# Patient Record
Sex: Male | Born: 1950
Health system: Southern US, Community
[De-identification: ages and names within clinical notes are randomized; demographics above are authoritative.]

## PROBLEM LIST (undated history)

## (undated) DIAGNOSIS — E7800 Pure hypercholesterolemia, unspecified: Secondary | ICD-10-CM

## (undated) HISTORY — PX: HIP FRACTURE SURGERY: SHX118

## (undated) HISTORY — DX: Pure hypercholesterolemia, unspecified: E78.00

---

## 2019-09-21 ENCOUNTER — Encounter: Payer: Self-pay | Admitting: Orthopaedic Surgery

## 2019-09-21 ENCOUNTER — Ambulatory Visit (INDEPENDENT_AMBULATORY_CARE_PROVIDER_SITE_OTHER): Payer: Self-pay | Admitting: Orthopaedic Surgery

## 2019-09-21 DIAGNOSIS — M25562 Pain in left knee: Secondary | ICD-10-CM

## 2019-09-21 NOTE — Progress Notes (Signed)
Office Visit Note   Patient: James Mcdaniel           Date of Birth: 1950/12/30           MRN: [id number] Visit Date: 09/21/2019              Requested by: No referring provider defined for this encounter. PCP: No primary care provider on file.   Assessment & Plan: Visit Diagnoses:  1. Acute pain of left knee     Plan: James Mcdaniel had a rather acute onset of left knee pain at the end of last week.  X-rays were performed through his primary care physician's office demonstrating some mild degenerative changes but no acute osseous abnormalities.  He was placed on Naprosyn 5 mg p.o. twice daily and relates that his pain is completely resolved.  I suspect his problem is related to the arthritis and had a long discussion regarding medicines injections etc.  I think at this point he can probably stop the medicine and monitor his discomfort.  He can always restart the medicine if necessary.  Plan to see him back as needed.  All questions were answered  Follow-Up Instructions: Return if symptoms worsen or fail to improve.   Orders:  No orders of the defined types were placed in this encounter.  No orders of the defined types were placed in this encounter.     Procedures: No procedures performed   Clinical Data: No additional findings.   Subjective: Chief Complaint  Patient presents with  . Left Knee - Pain  Patient presents today for left knee pain. He states that his knee started hurting and unable to walk last Friday, but now is fine. His pain was located posteriorly. He had x-rays taken prior to today that we can view on the PACS system. He is concerned about another flare up. He has taken Naproxen and had no more pain after taking two tablets.   HPI  Review of Systems   Objective: Vital Signs: BP 123/62   Pulse (!) 57   Ht 5\' 9"  (1.753 James)   Wt 172 lb (78 kg)   BMI 25.40 kg/James   Physical Exam Constitutional:      Appearance: He is well-developed.  Eyes:   Pupils: Pupils are equal, round, and reactive to light.  Pulmonary:     Effort: Pulmonary effort is normal.  Skin:    General: Skin is warm and dry.  Neurological:     Mental Status: He is alert and oriented to person, place, and time.  Psychiatric:        Behavior: Behavior normal.     Ortho Exam left knee was not hot red warm or swollen.  No effusion.  No localized areas of tenderness along the lateral medial joint of the subpatellar region.  Full quick extension and flexion over 120 degrees without instability.  No masses.  No calf pain.  No distal edema.  Straight leg raise negative.  Painless range of motion left hip  Specialty Comments:  No specialty comments available.  Imaging: No results found.   PMFS History: Patient Active Problem List   Diagnosis Date Noted  . Pain in left knee 09/21/2019   History reviewed. No pertinent past medical history.  History reviewed. No pertinent family history.  History reviewed. No pertinent surgical history. Social History   Occupational History  . Not on file  Tobacco Use  . Smoking status: Not on file  Substance and Sexual Activity  .  Alcohol use: Not on file  . Drug use: Not on file  . Sexual activity: Not on file

## 2021-03-27 ENCOUNTER — Telehealth: Payer: Self-pay | Admitting: Neurology

## 2021-03-27 ENCOUNTER — Encounter: Payer: Self-pay | Admitting: Neurology

## 2021-03-27 ENCOUNTER — Ambulatory Visit (INDEPENDENT_AMBULATORY_CARE_PROVIDER_SITE_OTHER): Payer: Medicare Other | Admitting: Neurology

## 2021-03-27 VITALS — BP 174/83 | HR 52 | Ht 69.0 in | Wt 170.0 lb

## 2021-03-27 DIAGNOSIS — R413 Other amnesia: Secondary | ICD-10-CM | POA: Diagnosis not present

## 2021-03-27 DIAGNOSIS — R4189 Other symptoms and signs involving cognitive functions and awareness: Secondary | ICD-10-CM | POA: Diagnosis not present

## 2021-03-27 DIAGNOSIS — Z818 Family history of other mental and behavioral disorders: Secondary | ICD-10-CM

## 2021-03-27 NOTE — Patient Instructions (Addendum)
MRI of the brain - James Mcdaniel [facility] Blood work today Formal memory testing - dr Ruffin Pyo [facility] healthy Brain study - consider joining MIND diet - [employer], consider looking into  Recommendations to prevent or slow progression of cognitive decline:   Exercise You should increase exercise 30 to 45 minutes per day at least 3 days a week although 5 to 7 would be preferred. Any type of exercise (including walking) is acceptable although a recumbent bicycle may be best if you are unsteady. Disease related apathy can be a significant roadblock to exercise and the only way to overcome this is to make it a daily routine and perhaps have a reward at the end (something your loved one loves to eat or drink perhaps) or a personal trainer coming to the home can also be very useful. In general a structured, repetitive schedule is best.   Cardiovascular Health: You should optimize all cardiovascular risk factors (blood pressure, sugar, cholesterol) as vascular disease such as strokes and heart attacks can make memory problems much worse.   Diet: Eating a heart healthy (Mediterranean) diet is also a good idea; fish and poultry instead of red meat, nuts (mostly non-peanuts), vegetables, fruits, olive oil or canola oil (instead of butter), minimal salt (use other spices to flavor foods), whole grain rice, bread, cereal and pasta and wine in moderation.  General Health: Any diseases which effect your body will effect your brain such as a pneumonia, urinary infection, blood clot, heart attack or stroke. Keep contact with your primary care doctor for regular follow ups.  Sleep. A good nights sleep is healthy for the brain. Seven hours is recommended. If you have insomnia or poor sleep habits see the recommendations below  Tips: Structured and consistent daytime and nighttime routine, including regular wake times, bedtimes, and mealtimes, will be important for the patient to avoid confusion. Keeping frequently  used items in designated places will help reduce stress from searching. If there are worries about getting lost do not let the patient leave home unaccompanied. They might benefit from wearing an identification bracelet that will help others assist in finding home if they become lost. Information about nationwide safe return services and other helpful resources may be obtained through the Alzheimer's Association helpline at [phone].  Finances, Power of Restaurant manager, fast food Directives: You should consider putting legal safeguards in place with regard to financial and medical decision making. While the spouse always has power of attorney for medical and financial issues in the absence of any form, you should consider what you want in case the spouse / caregiver is no longer around or capable of making decisions.   [place] : MovieEvening.com.au.pdf  Or Google "[facility]" AND "An Proofreader for [employer]  Other States: PainBasics.com.au  The signature on these forms should be notarized.   DRIVING:   Driving only during the day Drive only to familiar Locations Avoid driving during bad weather  If you would like to be tested to see if you are driving safely, [facility] has a Clinical Driving Evaluation. To schedule an appointment call [phone].                RESOURCES:  Memory Loss: Improve your short term memory By Laverle Patter  The Alzheimer's Reading Room http://www.alzheimersreadingroom.com/   The Alzheimer's Compendium http://www.alzcompend.info/  [employer] www.dukefamilysupport.NLZ [phone]  Recommended resources for caregivers (All can be purchased on Amazon):  1) A Caregiver's Guide to Dementia: Using Activities and Other Strategies  to Prevent, Reduce and Manage Behavioral Symptoms by Mahala Menghini.  Bethena Midget and [employer]   2) A Caregiver's Guide to Owens & Minor Dementia by Briant Sites MS BSN and Velia Meyer   3) What If It's Not Alzheimer's?: A Caregiver's Guide to Dementia by Neila Gear (Author), Roberts Gaudy (Editor)  3) The 36 hour day by Rabins and Mace  4) Understanding Difficult Behaviors by Gerre Scull and White  Online course for helping caregivers reduce stress, guilt and frustration called the Caregivers Helpbook. The website is www.powerfultoolsforcaregivers.org  As a caregiver you are a Government social research officer. Problems you face as a caregiver are usually unique to your situation and the way your loved-one's disease manifests itself. The best way to use these books is to look at the Table of Contents and read any chapters of interest or that apply to challenges you are having as a caregiver.  NATIONAL RESOURCES: For more information on neurological disorders or research programs funded by the [employer] of Neurological Disorders and Stroke, contact the Institute's Gaffer (BRAIN) at: BRAIN [address] [place] [address] [phone] (toll-free) ToledoAutomobile.co.uk  Information on dementia is also available from the following organizations: Alzheimer's Disease Education and Referral (ADEAR) Center [employer] on Aging [address] [place] [address] [phone] (toll-free) CashCowGambling.be  Alzheimer's Association [address] [place], [place] [address] [phone] (toll-free, 24-hour helpline) [phone] (TDD) LimitLaws.hu  Alzheimer's Foundation of [place] [address] [place], [place] [address] [phone] (toll-free) www.alzfdn.org  Alzheimer's Drug Discovery Foundation [address] [place], [place] [address] [phone] www.alzdiscovery.org  Association for Frontotemporal Degeneration [employer]  [address] [place], [place] [address] [phone] (toll-free) www.theaftd.[facility]  Bright[employer] Foundation [address] [place], [place] [address] [phone] (toll-free) www.brightfocus.org/alzheimers  [facility]arl Gala French Alzheimer's Foundation [address] [place], [place] [address] [phone] www.BushWebsites.nl  Lewy Body Dementia Association [address] [place], [place] [address] [phone] (toll-free LBD Caregiver Link) www.lbda.[facility] of Mental Health [address] [place], [place] [address] [phone] (toll-free) [phone] [facility]) http://www.maynard.net/  National Organization for Rare Disorders [address] [place], [place] [address] 5-993-570-VXBL [phone]) (toll-free) www.rarediseases.org  The Dementias: Hope Through Research was jointly produced by the [employer] of Neurological Disorders and Stroke (NINDS) and the [employer] on Aging (NIA), both part of the [employer], the [employer] research agency--supporting scientific studies that turn discovery into health. NINDS is the nation's leading funder of research on the brain and nervous system. The NINDS mission is to reduce the burden of neurological disease. For more information and resources, visit ToledoAutomobile.co.uk [1] or call ([phone]. NIA leads the federal government effort conducting and supporting research on aging and the health and well-being of older people. NIA's Alzheimer's Disease Education and Referral (ADEAR) Center offers information and publications on dementia and caregiving for families, caregivers, and professionals. For more information, visit CashCowGambling.be [2] or call [phone]. Also available from NIA are publications and information about Alzheimer's disease as well as the booklets Frontotemporal Disorders: Information for  Patients, Families, and Caregivers and Lewy Body Dementia: Information for Patients, Families, and Professionals. Source URL: VoiceZap.is

## 2021-03-27 NOTE — Telephone Encounter (Signed)
Medicare/mutual of [place] no auth require faxed to [facility], they will reach out to the patient to schedule.

## 2021-03-27 NOTE — Progress Notes (Signed)
[facility]    Provider:  Dr Lucia Gaskins Requesting Provider: Garnette Gunner, MD Primary Care Provider:  Garnette Gunner, MD  CC:  short term memory loss  HPI:  James Mcdaniel is a 70 y.o. male here as requested by Garnette Gunner, MD for short-term memory loss. Patient says he will forget what his wife told him to get at the grocery store, he may have to think about it and remembers 3/4 items. He says his mind races, he has to juggle a lot of things. Wife provides much information and she feels he is getting confused, he goes to the microwave and gets confused how to use it, he loses his water glass all the time and other items, wife noticed it more in the last 4-5 months worsening, started about a year ago with little things more short-term memory, he also repeats himself and tell her the same stories. Daughter has noticed the changes a well and the repeating of questions. Still driving and not getting lost. He has gotten a little confused with the finances, if he gets a new bill he may get confused, he may pay a bill wrong. He takes his own medications and doesn't forget about that, he is very good at medication. His mother had memory problems bu she also had a lot of medical problems, like COPD, she started having mid 55s with memory issues. A paternal cousin was diagnosed with dementia in her late 51s, progressed very fast, 1st cousin. He snores heavily. He falls asleep very quickly, he will doze off in the afternoons, when he sits he very active. When he wakesup in the morning he feels refreshed, he walks 4-6 miles a day. Snoring has decreased, she adjusts his pillows and now he doesn't snore as badly, no witnessed apneic events.    Review of Systems: Patient complains of symptoms per HPI as well as the following symptoms memory loss. Pertinent negatives and positives per HPI. All others negative.   Social History   Socioeconomic History   Marital status: Married     Spouse name: Not on file   Number of children: 2   Years of education: 14   Highest education level: Not on file  Occupational History   Not on file  Tobacco Use   Smoking status: Former    Packs/day: 1.50    Years: 25.00    Pack years: 37.50    Types: Cigarettes    Quit date: 41    Years since quitting: 35.7   Smokeless tobacco: Never  Vaping Use   Vaping Use: Never used  Substance and Sexual Activity   Alcohol use: Not on file   Drug use: Not on file   Sexual activity: Not on file  Other Topics Concern   Not on file  Social History Narrative   Lives at home with wife   Right handed   Caffeine: 2 cups coffee/day   Social Determinants of Health   Financial Resource Strain: Not on file  Food Insecurity: Not on file  Transportation Needs: Not on file  Physical Activity: Not on file  Stress: Not on file  Social Connections: Not on file  Intimate Partner Violence: Not on file    Family History  Problem Relation Age of Onset   Dementia Mother    COPD Mother    Heart failure Father    Rheumatic fever Father    Dementia Cousin     Past Medical History:  Diagnosis Date  High cholesterol     Patient Active Problem List   Diagnosis Date Noted   Short-term memory loss 03/27/2021   FHx: dementia 03/27/2021   Pain in left knee 09/21/2019    Past Surgical History:  Procedure Laterality Date   HIP FRACTURE SURGERY Left    pin; as a teen    Current Outpatient Medications  Medication Sig Dispense Refill   atorvastatin (LIPITOR) 20 MG tablet Take 20 mg by mouth daily.     lisinopril (ZESTRIL) 5 MG tablet Take 5 mg by mouth daily.     metFORMIN (GLUCOPHAGE-XR) 500 MG 24 hr tablet Take 500 mg by mouth daily.     No current facility-administered medications for this visit.    Allergies as of 03/27/2021   (No Known Allergies)    Vitals: BP (!) 174/83 (BP Location: Right Arm, Patient Position: Sitting)   Pulse (!) 52   Ht 5\' 9"  (1.753 m)   Wt 170 lb  (77.1 kg)   BMI 25.10 kg/m  Last Weight:  Wt Readings from Last 1 Encounters:  03/27/21 170 lb (77.1 kg)   Last Height:   Ht Readings from Last 1 Encounters:  03/27/21 5\' 9"  (1.753 m)     Physical exam: Exam: Gen: NAD, conversant, well nourised, well groomed                     CV: RRR, no MRG. No Carotid Bruits. No peripheral edema, warm, nontender Eyes: Conjunctivae clear without exudates or hemorrhage  Neuro: Detailed Neurologic Exam  Speech:    Speech is normal; fluent and spontaneous with normal comprehension.  Cognition:    The patient is oriented to person, place, and time;     recent and remote memory intact;     language fluent;     normal attention, concentration,     fund of knowledge Cranial Nerves:    The pupils are equal, round, and reactive to light. The fundi flat. Visual fields are full to finger confrontation. Extraocular movements are intact. Trigeminal sensation is intact and the muscles of mastication are normal. The face is symmetric. The palate elevates in the midline. Hearing intact. Voice is normal. Shoulder shrug is normal. The tongue has normal motion without fasciculations.   Coordination:    Normal    Gait:     normal.   Motor Observation:    No asymmetry, no atrophy, and no involuntary movements noted. Tone:    Normal muscle tone.    Posture:    Posture is normal. normal erect    Strength:    Strength is V/V in the upper and lower limbs.      Sensation: intact to LT     Reflex Exam:  DTR's:    Deep tendon reflexes in the upper and lower extremities are symmetrical  bilaterally.   Toes:    The toes are equivocal bilaterally.   Clonus:    Clonus is absent.    Assessment/Plan:  Very nice 70 year old patient with short-term memory loss, and FHx of dementia. May be normal cognitive aging but needs evaluation for prodromal alzheimer's disease. MRI of the brain for reversible causes of dementia.  [facility] of the brain -  [facility] Blood work today Formal memory testing - dr 61 [facility] healthy Brain study - consider joining MIND diet - Ruffin Pyo, consider looking into  Orders Placed This Encounter  Procedures   MR BRAIN W WO CONTRAST   TSH  B12 and Folate Panel   Methylmalonic acid, serum   Homocysteine   CBC with Differential/Platelets   Comprehensive metabolic panel   Vitamin B1   Ambulatory referral to Neuropsychology      Cc: Garnette Gunner, MD,  Garnette Gunner, MD  Naomie Dean, MD  [facility] [address] [place], [place] [address]  Phone [phone] Fax [phone]  I spent 60 minutes of face-to-face and non-face-to-face time with patient on the  1. Short-term memory loss   2. FHx: dementia   3. Cognitive decline    diagnosis.  This included previsit chart review, lab review, study review, order entry, electronic health record documentation, patient education on the different diagnostic and therapeutic options, counseling and coordination of care, risks and benefits of management, compliance, or risk factor reduction

## 2021-03-28 NOTE — Telephone Encounter (Signed)
Scheduled at [facility] for 04/10/21.

## 2021-04-01 ENCOUNTER — Encounter: Payer: Self-pay | Admitting: Psychology

## 2021-04-02 ENCOUNTER — Telehealth: Payer: Self-pay | Admitting: *Deleted

## 2021-04-02 LAB — COMPREHENSIVE METABOLIC PANEL
ALT: 16 IU/L (ref 0–44)
AST: 17 IU/L (ref 0–40)
Albumin/Globulin Ratio: 1.8 (ref 1.2–2.2)
Albumin: 4.6 g/dL (ref 3.7–4.7)
Alkaline Phosphatase: 87 IU/L (ref 44–121)
BUN/Creatinine Ratio: 14 (ref 10–24)
BUN: 14 mg/dL (ref 8–27)
Bilirubin Total: 1.1 mg/dL (ref 0.0–1.2)
CO2: 24 mmol/L (ref 20–29)
Calcium: 10 mg/dL (ref 8.6–10.2)
Chloride: 104 mmol/L (ref 96–106)
Creatinine, Ser: 1.01 mg/dL (ref 0.76–1.27)
Globulin, Total: 2.6 g/dL (ref 1.5–4.5)
Glucose: 124 mg/dL — ABNORMAL HIGH (ref 70–99)
Potassium: 5.6 mmol/L — ABNORMAL HIGH (ref 3.5–5.2)
Sodium: 145 mmol/L — ABNORMAL HIGH (ref 134–144)
Total Protein: 7.2 g/dL (ref 6.0–8.5)
eGFR: 79 mL/min/{1.73_m2} (ref >59)

## 2021-04-02 LAB — B12 AND FOLATE PANEL
Folate: >20 ng/mL (ref >3.0)
Vitamin B-12: 564 pg/mL (ref 232–1245)

## 2021-04-02 LAB — CBC WITH DIFFERENTIAL/PLATELET
Basophils Absolute: 0.1 10*3/uL (ref 0.0–0.2)
Basos: 1 %
EOS (ABSOLUTE): 0.1 10*3/uL (ref 0.0–0.4)
Eos: 1 %
Hematocrit: 44.9 % (ref 37.5–51.0)
Hemoglobin: 15 g/dL (ref 13.0–17.7)
Immature Grans (Abs): 0 10*3/uL (ref 0.0–0.1)
Immature Granulocytes: 1 %
Lymphocytes Absolute: 1.3 10*3/uL (ref 0.7–3.1)
Lymphs: 17 %
MCH: 29.6 pg (ref 26.6–33.0)
MCHC: 33.4 g/dL (ref 31.5–35.7)
MCV: 89 fL (ref 79–97)
Monocytes Absolute: 0.5 10*3/uL (ref 0.1–0.9)
Monocytes: 7 %
Neutrophils Absolute: 5.6 10*3/uL (ref 1.4–7.0)
Neutrophils: 73 %
Platelets: 176 10*3/uL (ref 150–450)
RBC: 5.06 x10E6/uL (ref 4.14–5.80)
RDW: 12.2 % (ref 11.6–15.4)
WBC: 7.6 10*3/uL (ref 3.4–10.8)

## 2021-04-02 LAB — HOMOCYSTEINE: Homocysteine: 8.8 umol/L (ref 0.0–19.2)

## 2021-04-02 LAB — TSH: TSH: 1.57 u[IU]/mL (ref 0.450–4.500)

## 2021-04-02 LAB — METHYLMALONIC ACID, SERUM: Methylmalonic Acid: 116 nmol/L (ref 0–378)

## 2021-04-02 LAB — VITAMIN B1: Thiamine: 153 nmol/L (ref 66.5–200.0)

## 2021-04-02 NOTE — Telephone Encounter (Signed)
-----   Message from Anson Fret, MD sent at 04/02/2021  1:02 PM EDT ----- Please call and let him know his potassium was just a little elevated: Blood work looks fine except your potassium is slightly elevated.  Unless you are having chest discomfort or any signs of cardiac problems I would not be too concerned, I would asked that to get that checked again when you see your primary care and watch for any chest pain or shortness of breath however unlikely.

## 2021-04-02 NOTE — Telephone Encounter (Signed)
Called and spoke with pt/wife about results per Dr. Trevor Mace note. Pt verbalized understanding. He will reach out to PCP about getting repeat labs set up to recheck potassium level. He will continue to monitor for any chest pain/SOB.

## 2021-04-10 ENCOUNTER — Other Ambulatory Visit: Payer: Self-pay

## 2021-04-10 ENCOUNTER — Ambulatory Visit (HOSPITAL_COMMUNITY)
Admission: RE | Admit: 2021-04-10 | Discharge: 2021-04-10 | Disposition: A | Discharge status: Home / Self Care | Payer: Medicare Other | Source: Ambulatory Visit | Attending: Neurology | Admitting: Neurology

## 2021-04-10 DIAGNOSIS — Z818 Family history of other mental and behavioral disorders: Secondary | ICD-10-CM | POA: Insufficient documentation

## 2021-04-10 DIAGNOSIS — R4189 Other symptoms and signs involving cognitive functions and awareness: Secondary | ICD-10-CM | POA: Diagnosis present

## 2021-04-10 DIAGNOSIS — R413 Other amnesia: Secondary | ICD-10-CM | POA: Diagnosis not present

## 2021-04-10 MED ORDER — GADOBUTROL 1 MMOL/ML IV SOLN
7.5000 mL | Freq: Once | INTRAVENOUS | Status: AC | PRN
  Administered 2021-04-10: 7.5 mL via INTRAVENOUS

## 2021-09-30 ENCOUNTER — Encounter: Payer: Self-pay | Admitting: Psychology

## 2021-09-30 ENCOUNTER — Encounter: Payer: Medicare Other | Attending: Psychology | Admitting: Psychology

## 2021-09-30 DIAGNOSIS — R413 Other amnesia: Secondary | ICD-10-CM | POA: Diagnosis present

## 2021-09-30 NOTE — Progress Notes (Signed)
Neuropsychological Consultation ? ? ?Patient:   James Mcdaniel  ? ?DOB:   13-Jan-1951 ? ?MR Number:  [id number] ? ?Location:  [facility] ?[place], [address] [place] ?[id number] ?[place] [address] ?Dept: [phone] ?          ?Date of Service:   09/30/2021 ? ?Start Time:   2 PM ?End Time:   4 PM ? ?Today's visit was an in person visit that was conducted in my outpatient clinic office.  The patient, his wife and myself were present.  1 hour and 15 minutes was spent in face-to-face clinical interview and the other 45 minutes was spent with records review, report writing and setting up testing protocols. ? ?Provider/Observer:  Ilean Skill, Psy.D.   ?    Clinical Neuropsychologist ?     ? ?Billing Code/Service: [id number] ? ?Chief Complaint:    James Mcdaniel is a 71 year old male referred by Sarina Ill, MD for neuropsychological evaluation as part of a larger neurological work-up.  The patient was initially referred to neurology around concerns for short-term memory loss by the patient's PCP Josephine Igo, MD.  The patient has been describing progressive memory changes particular around short-term memory over the past year or so.  The patient reports a shorter timeframe than his wife.  The patient has had recent MRI studies, blood work etc. all of which did not identify any potential etiological factors or causes.  Along with memory difficulties the patient does report that there are times when he will be driving and gets turned around when he is using his GPS by not particular listening to it or paying attention to his GPS.  He reports that when he is driving around his home town that he does not really have any difficulties but when he is in [place] that sometimes it is problematic.  He denies any tremors, visual or auditory hallucinations or changes in motor functioning.  The patient gives examples of his  primary difficulties such as going to the grocery store to get 6 items but only remembers 3.  The patient is managing his medications without issue and is only significant medical issue has to do with high cholesterol and past hip fracture when he was a teenager and pain in his left knee.  There is a family history of a paternal first cousin who was diagnosed with early onset dementia in her late 38s with fast progression.  The patient's mother had memory problems but there were a lot of medical issues including COPD and her symptoms began developing in her mid 67s. ? ?Reason for Service:  James Mcdaniel is a 71 year old male referred by Sarina Ill, MD for neuropsychological evaluation as part of a larger neurological work-up.  The patient was initially referred to neurology around concerns for short-term memory loss by the patient's PCP Josephine Igo, MD.  The patient has been describing progressive memory changes particular around short-term memory over the past year or so.  The patient reports a shorter timeframe than his wife.  The patient has had recent MRI studies, blood work etc. all of which did not identify any potential etiological factors or causes.  Along with memory difficulties the patient does report that there are times when he will be driving and gets turned around when he is using his GPS by not particular listening to it or paying attention to his GPS.  He reports that when he is driving around  his home town that he does not really have any difficulties but when he is in [place] that sometimes it is problematic.  He denies any tremors, visual or auditory hallucinations or changes in motor functioning.  The patient gives examples of his primary difficulties such as going to the grocery store to get 6 items but only remembers 3.  The patient is managing his medications without issue and is only significant medical issue has to do with high cholesterol and past hip fracture when he was a  teenager and pain in his left knee.  There is a family history of a paternal first cousin who was diagnosed with early onset dementia in her late 23s with fast progression.  The patient's mother had memory problems but there were a lot of medical issues including COPD and her symptoms began developing in her mid 61s. ? ?The patient reports that when he is doing activities that he has always done and likes that he remembers pretty well but relates to going to the grocery store not remembering what he is gone for completely and having to use GPS sometimes to keep him on track when driving.  The patient describes at most very mild changes and word finding.  The patient reports that he constantly cannot find things where he is previously put away.  The patient's wife reports that she started noticing issues between 1 and 1-1/2 years ago and reports that initially there were very subtle changes but has noticed progressive worsening over time.  She does not identify any particular change in language.  She does acknowledge a few times when he has gotten turned around when driving and reports that he is appears to be more nervous when he is out driving.  During the clinical interview today, there were several times when he deferred to his wife to describe some of the issues that are going on. ? ?The patient has had recent lab work including thiamine, niacin and B12 all of which were within normal limits.  The only issues identified was a mild elevation in potassium and some mild elevations in blood glucose.  Thyroid studies were within normal limits as well. ? ?The patient had an MRI conducted on 04/10/2021 that was a normal aging brain impression with no indication of acute abnormalities or specific structural abnormalities noted. ? ?The patient reports that he sleeps well and feels rested when he wakes up.  His wife acknowledges that the patient snores but has made positional changes when sleeping which helps and there  were no reports by his wife of any symptoms consistent with apneic events.  The patient has a good appetite and gets regular physical activity walking and least 6000 steps each day.  He eats healthy foods. ? ? ?Behavioral Observation: Taegan Bodiford  presents as a 71 y.o.-year-old Right handed Caucasian Male who appeared his stated age. his dress was Appropriate and he was Well Groomed and his manners were Appropriate to the situation.  his participation was indicative of Appropriate behaviors.  There were not physical disabilities noted.  he displayed an appropriate level of cooperation and motivation.   ? ? ?Interactions:    Active Appropriate ? ?Attention:   abnormal and attention span appeared shorter than expected for age ? ?Memory:   abnormal; remote memory intact, recent memory impaired ? ?Visuo-spatial:  not examined ? ?Speech (Volume):  normal ? ?Speech:   normal; normal ? ?Thought Process:  Coherent and Relevant ? ?Though Content:  WNL; not suicidal and  not homicidal ? ?Orientation:   person, place, time/date, and situation ? ?Judgment:   Good ? ?Planning:   Good ? ?Affect:    Appropriate ? ?Mood:    Anxious ? ?Insight:   Good ? ?Intelligence:   normal ? ?Marital Status/Living: The patient was born and raised in [place] with no siblings.  He is married and continues to live with his wife of 34 years.  The patient has a 66 year old son and a 21 year old daughter all of whom are doing well medically and cognitively. ? ?Current Employment: The patient is retired. ? 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  Hobbies and interests include hunting and fishing. ? ?Substance Use:  No concerns of substance abuse are reported. ? ?Education:   The patient completed high school as well as [employer] with a 3.5 GPA average attending [employer].  Patient never repeated any classes and there were no indications of any learning  disabilities.  Extracurricular activities included playing baseball and football.  The patient denied any history of significant concussions while playing football. ? ?Medical History:   ?Past Medical History:  ?Diag

## 2021-10-10 DIAGNOSIS — R413 Other amnesia: Secondary | ICD-10-CM | POA: Diagnosis not present

## 2021-10-10 NOTE — Progress Notes (Signed)
? ?Behavioral Observations ?The patient was well-groomed and appropriately dressed. His manners were polite and appropriate to the situation. The patient appeared anxious and upset throughout the duration of the test. He was very hard on himself saying "I feel stupid" and "dumb me" when he forgot the instructions or didn't understand something. The patient did have difficulty understanding instructions and required further explanation on many of the sub-tests. He was compliant with all testing instructions and his effort was fair. ? ?Neuropsychology Note ? ?James Mcdaniel completed 120 minutes of neuropsychological testing with technician, James Mcdaniel, BA, under the supervision of James Phenix, PsyD., Clinical Neuropsychologist. The patient did not appear overtly distressed by the testing session, per behavioral observation or via self-report to the technician. Rest breaks were offered.  ? ?Clinical Decision Making: In considering the patient's current level of functioning, level of presumed impairment, nature of symptoms, emotional and behavioral responses during clinical interview, level of literacy, and observed level of motivation/effort, a battery of tests was selected by James Mcdaniel during initial consultation on 09/30/2021. This was communicated to the technician. Communication between the neuropsychologist and technician was ongoing throughout the testing session and changes were made as deemed necessary based on patient performance on testing, technician observations and additional pertinent factors such as those listed above. ? ?Tests Administered: ?Controlled Oral Word Association Test (COWAT; FAS & Animals)  ?Wechsler Adult Intelligence Scale, 4th Edition (WAIS-IV) ?Wechsler Memory Scale, 4th Edition (WMS-IV); Older Adult Battery  ? ? ?Results: ? ?COWAT ? ?FAS Total = 32 ?Z = -0.83 ?Animals Total = 11 ?Z = -1.71 ? ? ? ? ? ? ?WAIS-IV ? ?Composite Score Summary  ?Scale Sum of ?Scaled Scores  Composite ?Score Percentile ?Rank 95% Conf. ?Interval Qualitative Description  ?Verbal Comprehension 20 VCI 81 10 76-87 Low Average  ?Perceptual Reasoning 18 PRI 77 6 72-84 Borderline  ?Working Memory 11 WMI 74 4 69-82 Borderline  ?Processing Speed 12 PSI 79 8 73-89 Borderline  ?Full Scale 61 FSIQ 74 4 70-79 Borderline  ?General Ability 38 GAI 77 6 73-83 Borderline  ? ? ? ? ? ?Verbal Comprehension Subtests Summary  ?Subtest Raw Score Scaled Score Percentile Rank Reference Group Scaled Score SEM  ?Similarities 17 7 16 6  0.95  ?Vocabulary 20 6 9 6  0.67  ?Information 9 7 16 8  0.73  ?(Comprehension) 17 7 16 7  1.27  ? ? ? ? ? ? ?Perceptual Reasoning Subtests Summary  ?Subtest Raw Score Scaled Score Percentile Rank Reference Group Scaled Score SEM  ?Block Design 16 6 9 4  0.99  ?Matrix Reasoning 5 5 5 2  0.90  ?Visual Puzzles 7 7 16 5  0.99  ?(Picture Completion) 6 7 16 5  0.99  ? ? ? ? ? ? ?Working  ?Subtest Raw Score Scaled Score Percentile Rank Reference Group Scaled Score SEM  ?Digit Span 15 4 2 3  0.73  ?Arithmetic 10 7 16 7  1.20  ? ? ? ? ? ? ?Processing Speed Subtests Summary  ?Subtest Raw Score Scaled Score Percentile Rank Reference Group Scaled Score SEM  ?Symbol Search 11 5 5 2  1.12  ?Coding 35 7 16 4  1.12  ? ? ? ? ? ? ? ?WMS-IV Older Adult ? ? ? ?Index Score Summary  ?Index Sum of Scaled Scores Index Score Percentile ?Rank 95% Confidence ?Interval Qualitative Descriptor  ?Auditory Memory (AMI) 7 47 <0.1 43-56 Extremely Low  ?Visual Memory (VMI) 2 40 <0.1 37-47 Extremely Low  ?Immediate Memory (IMI) 6 49 <0.1 45-58 Extremely  Low  ?Delayed Memory (DMI) 3 40 <0.1 37-52 Extremely Low  ? ? ? ? ? ?Primary Subtest Scaled Score Summary  ?Subtest Domain Raw Score Scaled Score Percentile Rank  ?Logical Memory I AM 15 4 2   ?Logical Memory II AM 0 1 0.1  ?Verbal Paired Associates I AM 1 1 0.1  ?Verbal Paired Associates II AM 0 1 0.1  ?Visual Reproduction I VM 4 1 0.1  ?Visual Reproduction II VM 0 1 0.1   ?Symbol Span VWM 5 4 2   ? ? ? ? ? ?Auditory Memory Process Score Summary  ?Process Score Raw Score Scaled Score Percentile Rank Cumulative Percentage ?(Base Rate)  ?LM II Recognition 16 - - 17-25%  ?VPA II Recognition 15 - - <=2%  ? ? ? ? ? ? ?Visual Memory Process Score Summary  ?Process Score Raw Score Scaled Score Percentile Rank Cumulative Percentage ?(Base Rate)  ?VR II Recognition 1 - - 3-9%  ? ? ? ? ? ?ABILITY-MEMORY ANALYSIS ? ?Ability Score:  VCI: 81 ?Date of Testing:  WAIS-IV; WMS-IV 2021/10/10 ? ?Predicted Difference Method   ?Index Predicted ?WMS-IV ?Index Score Actual ?WMS-IV ?Index Score Difference Critical Value ? Significant ?Difference ?Y/N Base Rate  ?Auditory Memory 90 47 43 10.41 Y <1%  ?Visual Memory 91 40 51 7.35 Y <1%  ?Immediate Memory 89 49 40 9.69 Y <1%  ?Delayed Memory 90 40 50 12.22 Y <1%  ?Statistical significance (critical value) at the .01 level.  ? ? ? ? ? ? ?Feedback to Patient: ?James Mcdaniel will return on 12/10/2021 for an interactive feedback session with James Mcdaniel at which time his test performances, clinical impressions and treatment recommendations will be reviewed in detail. The patient understands he can contact our office should he require our assistance before this time. ? ?120 minutes spent face-to-face with patient administering standardized tests, 30 minutes spent scoring (technician). [CPT 12/12/2021, 96139] ? ?Full report to follow.  ?

## 2021-11-20 ENCOUNTER — Encounter: Payer: Self-pay | Admitting: Psychology

## 2021-11-20 ENCOUNTER — Encounter: Payer: Medicare Other | Attending: Psychology | Admitting: Psychology

## 2021-11-20 DIAGNOSIS — G301 Alzheimer's disease with late onset: Secondary | ICD-10-CM | POA: Diagnosis not present

## 2021-11-20 DIAGNOSIS — F419 Anxiety disorder, unspecified: Secondary | ICD-10-CM | POA: Diagnosis not present

## 2021-11-20 DIAGNOSIS — F39 Unspecified mood [affective] disorder: Secondary | ICD-10-CM | POA: Insufficient documentation

## 2021-11-20 DIAGNOSIS — F23 Brief psychotic disorder: Secondary | ICD-10-CM | POA: Diagnosis not present

## 2021-11-20 DIAGNOSIS — F02A0 Dementia in other diseases classified elsewhere, mild, without behavioral disturbance, psychotic disturbance, mood disturbance, and anxiety: Secondary | ICD-10-CM | POA: Diagnosis not present

## 2021-11-20 NOTE — Progress Notes (Signed)
Neuropsychological Evaluation   Patient:  James Mcdaniel   DOB: 1950/11/09  MR Number: [id number]  Location: [facility] FOR PAIN AND [facility] MEDICINE [facility] PHYSICAL MEDICINE AND REHABILITATION [address] [place], [address] [id number] [facility] [place] [address] Dept: [phone]  Start: 3 PM End: 4 PM  Provider/Observer:     Hershal Coria PsyD  Chief Complaint:      Chief Complaint  Patient presents with   Memory Loss    Reason For Service:     James Mcdaniel is a 71 year old male referred by Naomie Dean, MD for neuropsychological evaluation as part of a larger neurological work-up.  The patient was initially referred to neurology around concerns for short-term memory loss by the patient's PCP Fanny Bien, MD.  The patient has been describing progressive memory changes, particularly around short-term memory over the past year or so.  The patient reports a shorter timeframe than his wife.  The patient has had recent MRI studies, blood work etc. all of which did not identify any potential etiological factors or causes.  Along with memory difficulties the patient does report that there are times when he James be driving and gets turned around when he is using his GPS by not particular listening to it or paying attention to his GPS.  He reports that when he is driving around his home town that he does not really have any difficulties but when he is in [place] that sometimes it is problematic.  He denies any tremors, visual or auditory hallucinations or changes in motor functioning.  The patient gives examples of his primary difficulties such as going to the grocery store to get 6 items but only remembers 3.  The patient is managing his medications without issue and his only significant medical issue has to do with high cholesterol and past hip fracture when he was a teenager and pain in his left knee.  There is a family history of a paternal first cousin who was  diagnosed with early onset dementia in her late 49s with fast progression.  The patient's mother had memory problems but there were a lot of medical issues including COPD and her symptoms began developing in her mid 51s.  The patient reports that when he is doing activities that he has always done and likes that he remembers pretty well but relates to going to the grocery store not remembering what he is gone for completely and having to use GPS sometimes to keep him on track when driving.  The patient describes at most very mild changes and word finding.  The patient reports that he constantly cannot find things where he is previously put away.  The patient's wife reports that she started noticing issues between 1 and 1-1/2 years ago and reports that initially there were very subtle changes but has noticed progressive worsening over time.  She does not identify any particular change in language.  She does acknowledge a few times when he has gotten turned around when driving and reports that he appears to be more nervous when he is out driving.  During the clinical interview today, there were several times when he deferred to his wife to describe some of the issues that are going on.  The patient has had recent lab work including thiamine, niacin and B12 all of which were within normal limits.  The only issues identified was a mild elevation in potassium and some mild elevations in blood glucose.  Thyroid studies were within normal  limits as well.  The patient had an MRI conducted on 04/10/2021 that was a normal aging brain impression with no indication of acute abnormalities or specific structural abnormalities noted.  The patient reports that he sleeps well and feels rested when he wakes up.  His wife acknowledges that the patient snores but has made positional changes when sleeping which helps and there were no reports by his wife of any symptoms consistent with apneic events.  The patient has a good  appetite and gets regular physical activity walking and least 6000 steps each day.  He eats healthy foods.  Tests Administered: Controlled Oral Word Association Test (COWAT; FAS & Animals)  Wechsler Adult Intelligence Scale, 4th Edition (WAIS-IV) Wechsler Memory Scale, 4th Edition (WMS-IV); Older Adult Battery   Participation Level:   Active  Participation Quality:  Appropriate      Behavioral Observation:  The patient was well-groomed and appropriately dressed. His manners were polite and appropriate to the situation. The patient appeared anxious and upset throughout the duration of the test. He was very hard on himself saying "I feel stupid" and "dumb me" when he forgot the instructions or didn't understand something. The patient did have difficulty understanding instructions and required further explanation on many of the sub-tests. He was compliant with all testing instructions and his effort was fair.  Well Groomed, Alert, and  frustrated at times .   Test Results:   Initially, an estimation was made as to his premorbid levels of cognitive and intellectual functioning to provide a comparison point with which to assess for potential cognitive changes.  The patient graduated from high school and completed his associates degree at [employer] with a 3.5 GPA average.  No indications of any learning disabilities or difficulties in school.  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  It is estimated that the patient likely has performed in the average to high average range and we James set a conservative estimate for comparisons to be in the average range although he likely performed higher on some individual components of  COWAT   FAS Total = 32 Z = -0.83 Animals Total = 11 Z = -1.71   Initially, the patient was administered the control oral Word Association test to assess both lexical and  semantic fluency as the patient does report sometimes of word finding difficulties.  On measures of lexical fluency (FAS test the patient performed in the average range although he was at the lower end of the average range relative to a age and education matched comparison group.  On measures of semantic fluency (animal naming) the patient displayed mild to moderate weaknesses with targeted naming.  This pattern is consistent with some word finding difficulties and retrieval of specific names.       WAIS-IV             Composite Score Summary         Scale Sum of Scaled Scores Composite Score Percentile Rank 95% Conf. Interval Qualitative Description  Verbal Comprehension 20 VCI 81 10 76-87 Low Average  Perceptual Reasoning 18 PRI 77 6 72-84 Borderline  Working Memory 11 WMI 74 4 69-82 Borderline  Processing Speed 12 PSI 79 8 73-89 Borderline  Full Scale 61 FSIQ 74 4 70-79 Borderline  General Ability 38 GAI 77 6 73-83 Borderline      The patient was administered the Wechsler Adult Intelligence  Scale-IV to provide an objective assessment of a broad range of cognitive domains in a highly standardized well normed battery of test.  As the patient and his wife are both describing changes in cognitive functioning and memory function the current score should not be used as a descriptor of his lifelong cognitive functioning but rather a descriptor of his current functioning status.  The patient produced a composite global functioning index score (full-scale IQ score) of 74 which falls at the 4th percentile and is in the borderline range relative to a normative population.  This is significantly below premorbid functioning nearly approaching 2 standard deviations below predicted levels of historical capacity.  We also calculated the patient's general abilities index score which places less emphasis on auditory encoding and information processing speed/focus execute type measures.  The patient produced  a general abilities index score of 77 which falls at the 6 percentile relative to normative population the combination of these 2 composite score strongly suggest that the patient is having difficulty in multiple areas of cognitive functioning and her not isolated to 1 individual area.             Verbal Comprehension Subtests Summary     Subtest Raw Score Scaled Score Percentile Rank Reference Group Scaled Score SEM  Similarities 17 7 16 6  0.95  Vocabulary 20 6 9 6  0.67  Information 9 7 16 8  0.73  (Comprehension) 17 7 16 7  1.27      The patient produced a verbal comprehension index score of 81 which falls at the 10th percentile and is roughly 20 points below predicted levels and thus greater than 1 standard deviation below predicted levels.  There is little to no scatter within subtest performance.  The patient displayed mild to moderate difficulties with regard to verbal reasoning and problem-solving capacity, ability to access vocabulary knowledge and his general fund of information as well as mild weaknesses with regard to social judgment comprehension.               Perceptual Reasoning Subtests Summary     Subtest Raw Score Scaled Score Percentile Rank Reference Group Scaled Score SEM  Block Design 16 6 9 4  0.99  Matrix Reasoning 5 5 5 2  0.90  Visual Puzzles 7 7 16 5  0.99  (Picture Completion) 6 7 16 5  0.99    The patient produced a perceptual reasoning index score of 77 which falls at the 6 percentile relative to a normative population.  Again, there was little variability in subtest performance with the patient consistently showed mild to moderate deficits with regard to visual spatial and visual constructional capacities.  Specifically, the patient had moderate deficits with regard to visual analysis and organizational capacity and visual reasoning and problem-solving abilities.  Visual estimation and judgment and his ability to identify visual anomalies within a visual gestalt were  all mildly impaired.                 Working Raw Score Scaled Score Percentile Rank Reference Group Scaled Score SEM  Digit Span 15 4 2 3  0.73  Arithmetic 10 7 16 7  1.20      The patient produced a working memory index score of 74 which falls in the 4th percentile and is in the borderline range relative to a normative population.  There was some degree of scatter noted on subtest performances.  The patient showed significant difficulties and impairment with regard to his  primary auditory encoding capacity but showed some improvement when asked to process this information that is being stored in active register.  The patient has significant difficulties with encoding but the information that is encoded is available for processing.               Processing Speed Subtests Summary     Subtest Raw Score Scaled Score Percentile Rank Reference Group Scaled Score SEM  Symbol Search 1.12  Coding 35 1.12      The patient produced a processing speed index score of 79 which falls at the 8th percentile and is in the borderline range relative to normative population.  Again, this is more than 1 standard deviation below predicted levels.  There was not significant scatter noted within subtest performance and he displayed mild to moderate deficits with regard to visual scanning, visual searching and overall speed of mental operations (focus execute abilities).         WMS-IV Older Adult               Index Score Summary       Index Sum of Scaled Scores Index Score Percentile Rank 95% Confidence Interval Qualitative Descriptor  Auditory Memory (AMI) 7 47 <0.1 43-56 Extremely Low  Visual Memory (VMI) 2 40 <0.1 37-47 Extremely Low  Immediate Memory (IMI) 6 49 <0.1 45-58 Extremely Low  Delayed Memory (DMI) 3 40 <0.1 37-52 Extremely Low      The patient was then administered the Wechsler Memory Scale-IV for older adults.  On the Wechsler Adult  Intelligence Scale the patient displayed significant deficits with regard to auditory encoding capacity but was able to process the information encoded while it was an active register.  The patient showed even greater deficits with regard to visual encoding capacity and clearly shows significant deficits with both auditory and visual encoding capacities.  This level of encoding deficits would have a significant impact on his ability to store, organize and retain new auditory and visual information.  Breaking memory down between auditory versus visual memory functions the patient produced an auditory memory index score of 47 which falls below the 0.1 percentile and is in the extremely low range relative to a normative population.  Similarly significant impairments were noted for visual memory as he produced a visual memory index score of 40 which falls again below the 0.1 percentile relative to a normative population.  There was no significant difference between auditory versus visual memory deficits.  Breaking memory down between immediate versus delayed memory the patient showed a similar pattern.  Immediate memory index score was 49 which fell below the 0.1 percentile and the delayed memory functions produced an index score of 40 which was also below the 0.1 percentile.  The patient performed even more poorly with his memory capacity than would be predicted by his encoding deficits suggesting that his memory deficits were likely combination of significant impairments for auditory and visual encoding and significant difficulty with storage and organization of information.  The patient was not able to retain information either initially or after period of delay.  The patient was placed in recognition/cued recall setting and also showed little to no improvement in performance suggesting that his memory deficits are not simply attentional and retrieval in nature but are also related to deficits with regard to  storage and organization of new information.            Primary Subtest Scaled Score  Summary     Subtest Domain Raw Score Scaled Score Percentile Rank  Logical Memory I AM 15 4 2   Logical Memory II AM 0 1 0.1  Verbal Paired Associates I AM 1 1 0.1  Verbal Paired Associates II AM 0 1 0.1  Visual Reproduction I VM 4 1 0.1  Visual Reproduction II VM 0 1 0.1  Symbol Span VWM 5 4 2                   Auditory Memory Process Score Summary      Process Score Raw Score Scaled Score Percentile Rank Cumulative Percentage (Base Rate)  LM II Recognition 16 - - 17-25%  VPA II Recognition 15 - - <=2%                     Visual Memory Process Score Summary      Process Score Raw Score Scaled Score Percentile Rank Cumulative Percentage (Base Rate)  VR II Recognition 1 - - 3-9%     ABILITY-MEMORY ANALYSIS   Ability Score:    VCI: 81 Date of Testing:           WAIS-IV; WMS-IV 2021/10/10             Predicted Difference Method    Index Predicted WMS-IV Index Score Actual WMS-IV Index Score Difference Critical Value   Significant Difference Y/N Base Rate  Auditory Memory 90 47 43 10.41 Y <1%  Visual Memory 91 40 51 7.35 Y <1%  Immediate Memory 89 49 40 9.69 Y <1%  Delayed Memory 90 40 50 12.22 Y <1%  Statistical significance (critical value) at the .01 level.      We also calculated the patient's ability-memory analysis where we used his verbal comprehension index score of 81 to produce addicted scores on various memory indices.  On all memory indices the patient performed significantly below predicted levels based on his verbal skills that are currently derived.  This suggest that there are clearly much greater memory deficits then some of the other cognitive deficits noted.   Summary of Results:   The results of the current neuropsychological evaluation do display and indicate global reduction in overall cognitive functioning.  The patient performed significantly below  predicted levels of premorbid functioning and almost every cognitive domain assessed.  This was true for issues related to verbal comprehension and verbal reasoning abilities, visual spatial and visual constructional capacity, auditory and visual encoding and processing information and is active register, and significant deficits with regard to overall information processing speed.  Individual components were all greater than 1 standard deviation and in some cases 2 standard deviations below predicted levels of functioning.  The patient showed even greater deficits with regard to memory and learning while he did show significant weaknesses for auditory and visual encoding which would have a deleterious impact on his capacity to learn new information he showed even further weaknesses with regard to auditory and visual memory and learning, immediate and delayed memory and learning and little to no improvement under recognition or cued recall formats.  The greatest deficits were identified for memory and learning and work present for aspects related to encoding, storage and organization and retention of information over period of delay.  Impression/Diagnosis:   Overall, the results of the current neuropsychological evaluation do highlight significant cognitive deficits through objective assessment.  There are even greater than were reported or acknowledged by either the patient or his wife and while his  wife identified greater difficulties and the patient reported subjectively they both did acknowledge difficulties but the patient tended to minimize some of these.  During the testing itself he was aware that he was not performing at levels that he expected his capacity to allow for.  The patient has had neuroimaging studies done and blood work and other medical studies done without identifying any particular etiological or causative variables.  The patient does not appear to be dealing with significant sleep apneas.   He is also denying any indications of motor tremors or visual/auditory hallucinations.  The patient has an identified progressive decline over the past roughly 2 years but I suspect they are likely to have been a longer timeframe for symptoms to have been developing but became clear and noticeable over the past 2 years.  The patient and his wife both indicate that there is a progressive nature to his cognitive changes.  The patient is utilizing his GPS more often but at times it can also be a distractor for him.  There is a family history of early onset dementia with a paternal first cousin but the patient is 95 so this would not likely be related to that.  The patient's mother had memory difficulties but cardiovascular and cerebrovascular issues were likely a primary component for her but this is not clear with available medical history.  As far as diagnostic considerations, the patient does show patterns consistent with significant memory and learning deficits both visually and auditorily as well as visual spatial deficits changes in information processing speed, attentional deficits with memory and learning being the most significantly and severely impaired.  The pattern of his course as well as the pattern of cognitive strengths and weaknesses along with subjective symptoms would be most consistent with a diagnosis of late onset dementia of the Alzheimer's type without behavioral disturbance.  The patient is experiencing no psychosis and he is continuing to perform his ADLs with challenge.  As this is the most likely explanation of his neurological presentation it is also likely that he James continue to show progressive decline.  To gain a more definitive diagnostic determination it would be appropriate to repeat testing in approximately 9 months.  I James need to sit down with the patient and his wife and discuss treatment recommendations as he is continuing to drive but James likely progressively decline if  this is an accurate diagnostic consideration.  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  At this point, there have not been particular or specific medicines tried regarding treatment of an Alzheimer's type condition.  He James be following up with his neurologist Dr. Lucia Gaskins and I suspect that she James review options as far as medication strategies.  Diagnosis:    Mild late onset Alzheimer's dementia without behavioral disturbance, psychotic disturbance, mood disturbance, or anxiety (HCC)   _____________________ Arley Phenix, Psy.D. Clinical Neuropsychologist

## 2021-11-26 DIAGNOSIS — Z23 Encounter for immunization: Secondary | ICD-10-CM | POA: Diagnosis not present

## 2021-12-04 ENCOUNTER — Ambulatory Visit: Payer: Medicare Other | Admitting: Psychology

## 2021-12-10 ENCOUNTER — Encounter: Payer: Medicare Other | Attending: Psychology | Admitting: Psychology

## 2021-12-10 DIAGNOSIS — F02A0 Dementia in other diseases classified elsewhere, mild, without behavioral disturbance, psychotic disturbance, mood disturbance, and anxiety: Secondary | ICD-10-CM | POA: Insufficient documentation

## 2021-12-10 DIAGNOSIS — G301 Alzheimer's disease with late onset: Secondary | ICD-10-CM | POA: Insufficient documentation

## 2021-12-11 ENCOUNTER — Encounter: Payer: Self-pay | Admitting: Psychology

## 2021-12-11 NOTE — Progress Notes (Signed)
12/10/2021: Today I provided feedback regarding the results of the recent neuropsychological evaluation.  Myself, the patient and his wife were present for this feedback session.  We reviewed the results and went over treatment recommendations including preparations for potential progression of his memory loss and other cognitive difficulties.  I have included a copy of the summary and impression/diagnosis below for convenience and the patient's full neuropsychological evaluation can be found in his EMR dated 11/20/2021.  We addressed issues such as his continued driving at this point he is limiting his driving primarily to around town and places that he is very familiar with we discussed the challenges of using GPS and he reports that he does not look at his GPS but just listens to its instructions.  He reports that most of the time when he is driving now that his wife monitors the GPS when they are going somewhere unfamiliar rather than him having to follow the GPS.  The patient reports that he has acknowledged some progression and difficulties with memory and it does appear to be a slowly progressing change over the past couple of years.  Neither his wife over the patient were surprised by the diagnostic considerations but I emphasized the late onset nature and how that contrast typically with early onset dementia.  As this was the first formal neuropsychological evaluation regarding this issue we have set the patient up for follow-up assessment in approximately 9 months.  The patient is going to reach back out to his treating neurologist Dr. Lucia Gaskins now that all of the evaluative studies have been completed so they can talk about potential medication interventions.  The patient remains quite active physically and has a very good diet and good sleep pattern.  His wife reassures me that they will stay on top of these foundational issues.  The patient's mood is good and denies significant depression or  anxiety.   Summary of Results:                        The results of the current neuropsychological evaluation do display and indicate global reduction in overall cognitive functioning.  The patient performed significantly below predicted levels of premorbid functioning and almost every cognitive domain assessed.  This was true for issues related to verbal comprehension and verbal reasoning abilities, visual spatial and visual constructional capacity, auditory and visual encoding and processing information and is active register, and significant deficits with regard to overall information processing speed.  Individual components were all greater than 1 standard deviation and in some cases 2 standard deviations below predicted levels of functioning.  The patient showed even greater deficits with regard to memory and learning while he did show significant weaknesses for auditory and visual encoding which would have a deleterious impact on his capacity to learn new information he showed even further weaknesses with regard to auditory and visual memory and learning, immediate and delayed memory and learning and little to no improvement under recognition or cued recall formats.  The greatest deficits were identified for memory and learning and work present for aspects related to encoding, storage and organization and retention of information over period of delay.   Impression/Diagnosis:                     Overall, the results of the current neuropsychological evaluation do highlight significant cognitive deficits through objective assessment.  There are even greater than were reported or acknowledged by either the patient  or his wife and while his wife identified greater difficulties and the patient reported subjectively they both did acknowledge difficulties but the patient tended to minimize some of these.  During the testing itself he was aware that he was not performing at levels that he expected his capacity  to allow for.  The patient has had neuroimaging studies done and blood work and other medical studies done without identifying any particular etiological or causative variables.  The patient does not appear to be dealing with significant sleep apneas.  He is also denying any indications of motor tremors or visual/auditory hallucinations.  The patient has an identified progressive decline over the past roughly 2 years but I suspect they are likely to have been a longer timeframe for symptoms to have been developing but became clear and noticeable over the past 2 years.  The patient and his wife both indicate that there is a progressive nature to his cognitive changes.  The patient is utilizing his GPS more often but at times it can also be a distractor for him.  There is a family history of early onset dementia with a paternal first cousin but the patient is 53 so this would not likely be related to that.  The patient's mother had memory difficulties but cardiovascular and cerebrovascular issues were likely a primary component for her but this is not clear with available medical history.  As far as diagnostic considerations, the patient does show patterns consistent with significant memory and learning deficits both visually and auditorily as well as visual spatial deficits changes in information processing speed, attentional deficits with memory and learning being the most significantly and severely impaired.  The pattern of his course as well as the pattern of cognitive strengths and weaknesses along with subjective symptoms would be most consistent with a diagnosis of late onset dementia of the Alzheimer's type without behavioral disturbance.  The patient is experiencing no psychosis and he is continuing to perform his ADLs with challenge.  As this is the most likely explanation of his neurological presentation it is also likely that he will continue to show progressive decline.  To gain a more definitive  diagnostic determination it would be appropriate to repeat testing in approximately 9 months.  I will need to sit down with the patient and his wife and discuss treatment recommendations as he is continuing to drive but will likely progressively decline if this is an accurate diagnostic consideration.  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  At this point, there have not been particular or specific medicines tried regarding treatment of an Alzheimer's type condition.  He will be following up with his neurologist Dr. Lucia Gaskins and I suspect that she will review options as far as medication strategies.   Diagnosis:                                Mild late onset Alzheimer's dementia without behavioral disturbance, psychotic disturbance, mood disturbance, or anxiety (HCC)     _____________________ Arley Phenix, Psy.D. Clinical Neuropsychologist

## 2022-02-26 ENCOUNTER — Encounter: Payer: Self-pay | Admitting: Neurology

## 2022-02-26 ENCOUNTER — Telehealth: Payer: Self-pay | Admitting: Neurology

## 2022-02-26 ENCOUNTER — Telehealth (INDEPENDENT_AMBULATORY_CARE_PROVIDER_SITE_OTHER): Payer: Medicare Other | Admitting: Neurology

## 2022-02-26 DIAGNOSIS — G301 Alzheimer's disease with late onset: Secondary | ICD-10-CM | POA: Diagnosis not present

## 2022-02-26 DIAGNOSIS — F02A0 Dementia in other diseases classified elsewhere, mild, without behavioral disturbance, psychotic disturbance, mood disturbance, and anxiety: Secondary | ICD-10-CM | POA: Diagnosis not present

## 2022-02-26 MED ORDER — DONEPEZIL HCL 5 MG PO TABS: ORAL_TABLET | ORAL | 6 refills | Status: DC

## 2022-02-26 NOTE — Patient Instructions (Addendum)
- Start Aricept/Donepezil 5mg  but cut the pill in half for a few weeks and monitor pulse and look for symptoms we discussed. For any chest pain call 911. Take at bedtime. Watch for nightmares(if so take in the morning) and stop for diarrhea or significant GI problems.  - Next if no symptoms would increase Aricept to 10mg , but I'd want to see you in the office in 8 weeks - A second medication is Namenda/Memantine that we could start after you are on a full of Aricept - The new medication is Lecanumab: memory. : This medication is in its early stages in clinical use recently approved and we can discuss at office appointment. See link above and lots of information on the internet - [facility] referral to discuss Lecanumab, they declined at this time - in office 8 weeks to discuss lecanumab more and discuss diagnosis face to face   Donepezil Disintegrating Tablets What is this medication? DONEPEZIL (doe NEP e zil) treats memory loss and confusion (dementia) in people who have Alzheimer disease. It works by improving attention, memory, and the ability to engage in daily activities. It is not a cure for dementia or Alzheimer disease. This medicine may be used for other purposes; ask your health care provider or pharmacist if you have questions. COMMON BRAND NAME(S): Aricept What should I tell my care team before I take this medication? They need to know if you have any of these conditions: Asthma or other lung disease Difficulty passing urine Head injury Heart disease History of irregular heartbeat Liver disease Seizures (convulsions) Stomach or intestinal disease, ulcers or stomach bleeding An unusual or allergic reaction to donepezil, other medications, foods, dyes, or preservatives Pregnant or trying to get pregnant Breast-feeding How should I use this medication? Take this medication by mouth. Follow the directions on the prescription label. Place the tablet in the mouth  and allow it to dissolve, then swallow. While you may take these tablets with water, it is not necessary to do so. You may take this medication with or without food. Take your doses at regular intervals. This medication is usually taken before bedtime. Do not take your medication more often than directed. Continue to take your medication even if you feel better. Do not stop taking except on the advice of your care team. Talk to your care team about the use of this medication in children. Special care may be needed. Overdosage: If you think you have taken too much of this medicine contact a poison control center or emergency room at once. NOTE: This medicine is only for you. Do not share this medicine with others. What if I miss a dose? If you miss a dose, take it as soon as you can. If it is almost time for your next dose, take only that dose. Do not take double or extra doses. What may interact with this medication? Do not take this medication with any of the following: Certain medications for fungal infections like itraconazole, fluconazole, posaconazole, and voriconazole Cisapride Dextromethorphan; quinidine Dronedarone Pimozide Quinidine Thioridazine This medication may also interact with the following: Antihistamines for allergy, cough and cold Atropine Bethanechol Carbamazepine Certain medications for bladder problems like oxybutynin, tolterodine Certain medications for Parkinson's disease like benztropine, trihexyphenidyl Certain medications for stomach problems like dicyclomine, hyoscyamine Certain medications for travel sickness like scopolamine Dexamethasone Dofetilide Ipratropium NSAIDs, medications for pain and inflammation, like ibuprofen or naproxen Other medications for Alzheimer's disease Other medications that prolong the QT interval (cause an abnormal heart  rhythm) Phenobarbital Phenytoin Rifampin, rifabutin or rifapentine Ziprasidone This list may not describe all  possible interactions. Give your health care provider a list of all the medicines, herbs, non-prescription drugs, or dietary supplements you use. Also tell them if you smoke, drink alcohol, or use illegal drugs. Some items may interact with your medicine. What should I watch for while using this medication? Visit your care team for regular checks on your progress. Check with your care team if your symptoms do not get better or if they get worse. You may get drowsy or dizzy. Do not drive, use machinery, or do anything that needs mental alertness until you know how this medication affects you. What side effects may I notice from receiving this medication? Side effects that you should report to your care team as soon as possible: Allergic reactions--skin rash, itching, hives, swelling of the face, lips, tongue, or throat Peptic ulcer--burning stomach pain, loss of appetite, bloating, burping, heartburn, nausea, vomiting Seizures Slow heartbeat--dizziness, feeling faint or lightheaded, confusion, trouble breathing, unusual weakness or fatigue Stomach bleeding--bloody or black, tar-like stools, vomiting blood or brown material that looks like coffee grounds Trouble passing urine Side effects that usually do not require medical attention (report these to your care team if they continue or are bothersome): Diarrhea Fatigue Loss of appetite Muscle pain or cramps Nausea Trouble sleeping This list may not describe all possible side effects. Call your doctor for medical advice about side effects. You may report side effects to FDA at 1-800-FDA-1088. Where should I keep my medication? Keep out of reach of children. Store at room temperature between 15 and 30 degrees C (59 and 86 degrees F). Throw away any unused medication after the expiration date. NOTE: This sheet is a summary. It may not cover all possible information. If you have questions about this medicine, talk to your doctor, pharmacist, or  health care provider.  2023 Elsevier/Gold Standard (2021-01-03 00:00:00)

## 2022-02-26 NOTE — Telephone Encounter (Signed)
Please schedule in office in 8 weeks with dr Lucia Gaskins prefer last appointment of the day or last appointment of the morning for extra time or need an hour

## 2022-02-26 NOTE — Progress Notes (Signed)
[facility]    Provider:  Dr Lucia Gaskins Requesting Provider: No ref. provider found Primary Care Provider:  Donetta Potts, MD  Virtual Visit via Video Note  I connected with James Mcdaniel on 02/26/22 at  7:30 AM EDT by a video enabled telemedicine application and verified that I am speaking with the correct person using two identifiers.  Location: Patient: home Provider: office   I discussed the limitations of evaluation and management by telemedicine and the availability of in person appointments. The patient expressed understanding and agreed to proceed.  Follow Up Instructions:    I discussed the assessment and treatment plan with the patient. The patient was provided an opportunity to ask questions and all were answered. The patient agreed with the plan and demonstrated an understanding of the instructions.   The patient was advised to call back or seek an in-person evaluation if the symptoms worsen or if the condition fails to improve as anticipated.  I provided over 40 minutes of non-face-to-face time during this encounter.   Anson Fret, MD   CC:  Mild late onset Alzheimer's dementia without behavioral disturbance, psychotic disturbance, mood disturbance, or anxiety (HCC)  Follow up 02/26/2022: We discussed diagnosis. MRi of the brain and labwork unremarkable (rebiewed images), we discussed what to do next and lecanumab as below  - Start Aricept/Donepezil 5mg  but cut the pill in half for a few weeks and monitor pulse and look for symptoms we discussed. For any chest pain call 911. Take at bedtime. Watch for nightmares(if so take in the morning) and stop for diarrhea or significant GI problems.  - Next if no symptoms would increase Aricept to 10mg , but I'd want to see you in the office in 8 weeks - A second medication is Namenda/Memantine that we could start after you are on a full of Aricept - The new medication is Lecanumab:  memory. : This medication is in its early stages in clinical use recently approved and we can discuss at office appointment. See link above and lots of information on the internet - [facility] referral to discuss Lecanumab, they declined at this time - in office 8 weeks to discuss lecanumab more and discuss diagnosis face to face  Patient complains of symptoms per HPI as well as the following symptoms: memory loss . Pertinent negatives and positives per HPI. All others negative   HPI:  James Mcdaniel is a 71 y.o. male here as requested by No ref. provider found for short-term memory loss. Patient says he will forget what his wife told him to get at the grocery store, he may have to think about it and remembers 3/4 items. He says his mind races, he has to juggle a lot of things. Wife provides much information and she feels he is getting confused, he goes to the microwave and gets confused how to use it, he loses his water glass all the time and other items, wife noticed it more in the last 4-5 months worsening, started about a year ago with little things more short-term memory, he also repeats himself and tell her the same stories. Daughter has noticed the changes a well and the repeating of questions. Still driving and not getting lost. He has gotten a little confused with the finances, if he gets a new bill he may get confused, he may pay a bill wrong. He takes his own medications and doesn't forget about that, he is very good at medication. His mother had memory problems  bu she also had a lot of medical problems, like COPD, she started having mid 54s with memory issues. A paternal cousin was diagnosed with dementia in her late 82s, progressed very fast, 1st cousin. He snores heavily. He falls asleep very quickly, he will doze off in the afternoons, when he sits he very active. When he wakesup in the morning he feels refreshed, he walks 4-6 miles a day. Snoring has decreased, she adjusts  his pillows and now he doesn't snore as badly, no witnessed apneic events.    Review of Systems: Patient complains of symptoms per HPI as well as the following symptoms memory loss. Pertinent negatives and positives per HPI. All others negative.   Social History   Socioeconomic History   Marital status: Married    Spouse name: Not on file   Number of children: 2   Years of education: 14   Highest education level: Not on file  Occupational History   Not on file  Tobacco Use   Smoking status: Former    Packs/day: 1.50    Years: 25.00    Total pack years: 37.50    Types: Cigarettes    Quit date: 31    Years since quitting: 36.6   Smokeless tobacco: Never  Vaping Use   Vaping Use: Never used  Substance and Sexual Activity   Alcohol use: Not on file   Drug use: Not on file   Sexual activity: Not on file  Other Topics Concern   Not on file  Social History Narrative   Lives at home with wife   Right handed   Caffeine: 2 cups coffee/day   Social Determinants of Health   Financial Resource Strain: Not on file  Food Insecurity: Not on file  Transportation Needs: Not on file  Physical Activity: Not on file  Stress: Not on file  Social Connections: Not on file  Intimate Partner Violence: Not on file    Family History  Problem Relation Age of Onset   Dementia Mother    COPD Mother    Heart failure Father    Rheumatic fever Father    Dementia Cousin     Past Medical History:  Diagnosis Date   High cholesterol     Patient Active Problem List   Diagnosis Date Noted   Mild late onset Alzheimer's dementia without behavioral disturbance, psychotic disturbance, mood disturbance, or anxiety (HCC) 02/26/2022   Short-term memory loss 03/27/2021   FHx: dementia 03/27/2021   Pain in left knee 09/21/2019    Past Surgical History:  Procedure Laterality Date   HIP FRACTURE SURGERY Left    pin; as a teen    Current Outpatient Medications  Medication Sig Dispense  Refill   donepezil (ARICEPT) 5 MG tablet Take 1/2 a pill for 2 weeks and monitor pulse and symptoms and then increase to a full dose. 30 tablet 6   atorvastatin (LIPITOR) 20 MG tablet Take 20 mg by mouth daily.     lisinopril (ZESTRIL) 5 MG tablet Take 5 mg by mouth daily.     metFORMIN (GLUCOPHAGE-XR) 500 MG 24 hr tablet Take 500 mg by mouth daily.     No current facility-administered medications for this visit.    Allergies as of 02/26/2022   (No Known Allergies)    Vitals: There were no vitals taken for this visit. Last Weight:  Wt Readings from Last 1 Encounters:  03/27/21 170 lb (77.1 kg)   Last Height:   Ht Readings from Last  1 Encounters:  03/27/21 5\' 9"  (1.753 m)    Physical exam: Exam: Gen: NAD, conversant      CV: Could not perform over Web Video. Denies palpitations or chest pain or SOB. VS: Breathing at a normal rate. Weight appears within normal limits. Not febrile. Eyes: Conjunctivae clear without exudates or hemorrhage  Neuro: Detailed Neurologic Exam  Speech:    Speech is normal; fluent and spontaneous with normal comprehension.  Cognition:    The patient is oriented to person, place, and time;     recent and remote memory intact;     language fluent;     normal attention, concentration,     fund of knowledge Cranial Nerves:    The pupils are equal, round, and reactive to light. Cannot perform fundoscopic exam. Visual fields are full to finger confrontation. Extraocular movements are intact.  The face is symmetric with normal sensation. The palate elevates in the midline. Hearing intact. Voice is normal. Shoulder shrug is normal. The tongue has normal motion without fasciculations.   Coordination:    Normal finger to nose  Gait:    Normal native gait  Motor Observation:   no involuntary movements noted. Tone:    Appears normal  Posture:    Posture is normal. normal erect    Strength:    Strength is anti-gravity and symmetric in the upper and  lower limbs.      Sensation: intact to LT    Assessment/Plan:  Very nice 71 year old patient with short-term memory loss, and FHx of dementia. May be normal cognitive aging but needs evaluation for prodromal alzheimer's disease. MRI of the brain for reversible causes of dementia and bloodwork unremarkable. Formal memory testing diagnosed with Mild late onset Alzheimer's dementia without behavioral disturbance, psychotic disturbance, mood disturbance, or anxiety (HCC)  - We discussed diagnosis. MRi of the brain and labwork unremarkable (rebiewed images), we discussed what to do next and lecanumab  - Start Aricept/Donepezil 5mg  but cut the pill in half for a few weeks and monitor pulse and look for symptoms we discussed. For any chest pain call 911. Take at bedtime. Watch for nightmares(if so take in the morning) and stop for diarrhea or significant GI problems.  - Next if no symptoms would increase Aricept to 10mg , but I'd want to see you in the office in 8 weeks - A second medication is Namenda/Memantine that we could start after you are on a full of Aricept - The new medication is Lecanumab: memory.61 : This medication is in its early stages in clinical use recently approved and we can discuss at office appointment. See link above and lots of information on the internet - [facility] referral to discuss Lecanumab, they declined at this time - in office 8 weeks to discuss lecanumab more and discuss diagnosis face to face  Cc: No ref. provider found,  , MD  [email or web address], MD  [facility] [address] [place], [facility] [address]  Phone [phone] Fax [phone]

## 2022-02-26 NOTE — Telephone Encounter (Signed)
LVM asking pt to call back to schedule.

## 2022-02-28 DIAGNOSIS — R413 Other amnesia: Secondary | ICD-10-CM | POA: Diagnosis not present

## 2022-02-28 DIAGNOSIS — E782 Mixed hyperlipidemia: Secondary | ICD-10-CM | POA: Diagnosis not present

## 2022-02-28 DIAGNOSIS — Z1211 Encounter for screening for malignant neoplasm of colon: Secondary | ICD-10-CM | POA: Diagnosis not present

## 2022-02-28 DIAGNOSIS — Z6824 Body mass index (BMI) 24.0-24.9, adult: Secondary | ICD-10-CM | POA: Diagnosis not present

## 2022-02-28 DIAGNOSIS — E1169 Type 2 diabetes mellitus with other specified complication: Secondary | ICD-10-CM | POA: Diagnosis not present

## 2022-02-28 DIAGNOSIS — I10 Essential (primary) hypertension: Secondary | ICD-10-CM | POA: Diagnosis not present

## 2022-02-28 DIAGNOSIS — E559 Vitamin D deficiency, unspecified: Secondary | ICD-10-CM | POA: Diagnosis not present

## 2022-02-28 DIAGNOSIS — Z23 Encounter for immunization: Secondary | ICD-10-CM | POA: Diagnosis not present

## 2022-02-28 DIAGNOSIS — Z0000 Encounter for general adult medical examination without abnormal findings: Secondary | ICD-10-CM | POA: Diagnosis not present

## 2022-02-28 DIAGNOSIS — E7849 Other hyperlipidemia: Secondary | ICD-10-CM | POA: Diagnosis not present

## 2022-03-13 ENCOUNTER — Ambulatory Visit: Payer: Medicare Other | Admitting: Neurology

## 2022-04-23 ENCOUNTER — Ambulatory Visit: Payer: Medicare Other | Admitting: Neurology

## 2022-05-01 ENCOUNTER — Telehealth (INDEPENDENT_AMBULATORY_CARE_PROVIDER_SITE_OTHER): Payer: Medicare Other | Admitting: Neurology

## 2022-05-01 ENCOUNTER — Encounter: Payer: Self-pay | Admitting: Neurology

## 2022-05-01 ENCOUNTER — Telehealth: Payer: Self-pay | Admitting: Neurology

## 2022-05-01 DIAGNOSIS — G301 Alzheimer's disease with late onset: Secondary | ICD-10-CM | POA: Diagnosis not present

## 2022-05-01 DIAGNOSIS — F02A0 Dementia in other diseases classified elsewhere, mild, without behavioral disturbance, psychotic disturbance, mood disturbance, and anxiety: Secondary | ICD-10-CM

## 2022-05-01 MED ORDER — MEMANTINE HCL 5 MG PO TABS: 5.0000 mg | ORAL_TABLET | Freq: Two times a day (BID) | ORAL | 6 refills | Status: DC

## 2022-05-01 NOTE — Progress Notes (Signed)
[facility]    Provider:  Dr Lucia Gaskins Requesting Provider: Donetta Potts, MD Primary Care Provider:  Donetta Potts, MD  Virtual Visit via Video Note  I connected with James Mcdaniel on 05/01/22 at 10:00 AM EDT by a video enabled telemedicine application and verified that I am speaking with the correct person using two identifiers.  Location: Patient: home Provider: office   I discussed the limitations of evaluation and management by telemedicine and the availability of in person appointments. The patient expressed understanding and agreed to proceed.  Follow Up Instructions:    I discussed the assessment and treatment plan with the patient. The patient was provided an opportunity to ask questions and all were answered. The patient agreed with the plan and demonstrated an understanding of the instructions.   The patient was advised to call back or seek an in-person evaluation if the symptoms worsen or if the condition fails to improve as anticipated.  I provided over 20 minutes of non-face-to-face time during this encounter.   Anson Fret, MD   CC:  Mild late onset Alzheimer's dementia without behavioral disturbance, psychotic disturbance, mood disturbance, or anxiety (HCC)  05/01/2022: having some loose bowel movements, will not increase aricept, take fiber and bulking. Start Sasser as well. Having some weird dreams, switch the aricept to the morning. Memory is stable. Discussed lecanumab, they wish to hold on testing.   Patient complains of symptoms per HPI as well as the following symptoms: loose stools . Pertinent negatives and positives per HPI. All others negative  Follow up 02/26/2022: We discussed diagnosis. MRi of the brain and labwork unremarkable (rebiewed images), we discussed what to do next and lecanumab as below, we discussed Pet amyloid and genetic testing.    02/26/2022:  - Start Aricept/Donepezil 5mg  but cut the pill in half for  a few weeks and monitor pulse and look for symptoms we discussed. For any chest pain call 911. Take at bedtime. Watch for nightmares(if so take in the morning) and stop for diarrhea or significant GI problems.  - Next if no symptoms would increase Aricept to 10mg , but I'd want to see you in the office in 8 weeks - A second medication is Namenda/Memantine that we could start after you are on a full of Aricept - The new medication is Lecanumab: memory. : This medication is in its early stages in clinical use recently approved and we can discuss at office appointment. See link above and lots of information on the internet - [facility] referral to discuss Lecanumab, they declined at this time - in office 8 weeks to discuss lecanumab more and discuss diagnosis face to face  Patient complains of symptoms per HPI as well as the following symptoms: memory loss . Pertinent negatives and positives per HPI. All others negative   HPI:  James Mcdaniel is a 71 y.o. male here as requested by James Roca, MD for short-term memory loss. Patient says he will forget what his wife told him to get at the grocery store, he may have to think about it and remembers 3/4 items. He says his mind races, he has to juggle a lot of things. Wife provides much information and she feels he is getting confused, he goes to the microwave and gets confused how to use it, he loses his water glass all the time and other items, wife noticed it more in the last 4-5 months worsening, started about a year ago with little things more  short-term memory, he also repeats himself and tell her the same stories. Daughter has noticed the changes a well and the repeating of questions. Still driving and not getting lost. He has gotten a little confused with the finances, if he gets a new bill he may get confused, he may pay a bill wrong. He takes his own medications and doesn't forget about that, he is very good at medication. His  mother had memory problems bu she also had a lot of medical problems, like COPD, she started having mid 5s with memory issues. A paternal cousin was diagnosed with dementia in her late 51s, progressed very fast, 1st cousin. He snores heavily. He falls asleep very quickly, he will doze off in the afternoons, when he sits he very active. When he wakesup in the morning he feels refreshed, he walks 4-6 miles a day. Snoring has decreased, she adjusts his pillows and now he doesn't snore as badly, no witnessed apneic events.    Review of Systems: Patient complains of symptoms per HPI as well as the following symptoms memory loss. Pertinent negatives and positives per HPI. All others negative.   Social History   Socioeconomic History   Marital status: Married    Spouse name: Not on file   Number of children: 2   Years of education: 14   Highest education level: Not on file  Occupational History   Not on file  Tobacco Use   Smoking status: Former    Packs/day: 1.50    Years: 25.00    Total pack years: 37.50    Types: Cigarettes    Quit date: 29    Years since quitting: 36.8   Smokeless tobacco: Never  Vaping Use   Vaping Use: Never used  Substance and Sexual Activity   Alcohol use: Not on file   Drug use: Not on file   Sexual activity: Not on file  Other Topics Concern   Not on file  Social History Narrative   Lives at home with wife   Right handed   Caffeine: 2 cups coffee/day   Social Determinants of Health   Financial Resource Strain: Not on file  Food Insecurity: Not on file  Transportation Needs: Not on file  Physical Activity: Not on file  Stress: Not on file  Social Connections: Not on file  Intimate Partner Violence: Not on file    Family History  Problem Relation Age of Onset   Dementia Mother    COPD Mother    Heart failure Father    Rheumatic fever Father    Dementia Cousin     Past Medical History:  Diagnosis Date   High cholesterol     Patient  Active Problem List   Diagnosis Date Noted   Mild late onset Alzheimer's dementia without behavioral disturbance, psychotic disturbance, mood disturbance, or anxiety (HCC) 02/26/2022   Short-term memory loss 03/27/2021   FHx: dementia 03/27/2021   Pain in left knee 09/21/2019    Past Surgical History:  Procedure Laterality Date   HIP FRACTURE SURGERY Left    pin; as a teen    Current Outpatient Medications  Medication Sig Dispense Refill   memantine (NAMENDA) 5 MG tablet Take 1 tablet (5 mg total) by mouth 2 (two) times daily. 60 tablet 6   atorvastatin (LIPITOR) 20 MG tablet Take 20 mg by mouth daily.     donepezil (ARICEPT) 5 MG tablet Take 1/2 a pill for 2 weeks and monitor pulse and symptoms and then  increase to a full dose. 30 tablet 6   lisinopril (ZESTRIL) 5 MG tablet Take 5 mg by mouth daily.     metFORMIN (GLUCOPHAGE-XR) 500 MG 24 hr tablet Take 500 mg by mouth daily.     No current facility-administered medications for this visit.    Allergies as of 05/01/2022   (No Known Allergies)    Vitals: There were no vitals taken for this visit. Last Weight:  Wt Readings from Last 1 Encounters:  03/27/21 170 lb (77.1 kg)   Last Height:   Ht Readings from Last 1 Encounters:  03/27/21 5\' 9"  (1.753 m)    Physical exam: Exam: Gen: NAD, conversant      CV: Denies palpitations or chest pain or SOB. VS: Breathing at a normal rate. Weight appears within normal limits.Not febrile. Eyes: Conjunctivae clear without exudates or hemorrhage  Neuro: Detailed Neurologic Exam  Speech:    Speech is normal; fluent and spontaneous with normal comprehension.  Cognition:    The patient is oriented to person, place, and time;     recent and remote memory intact;     language fluent;     normal attention, concentration,     fund of knowledge Cranial Nerves:    The pupils are equal, round, and reactive to light. Visual fields are full to finger confrontation. Extraocular movements  are intact.  The face is symmetric with normal sensation. The palate elevates in the midline. Hearing intact. Voice is normal. Shoulder shrug is normal. The tongue has normal motion without fasciculations.   Coordination:    Normal finger to nose  Gait:    Normal native gait  Motor Observation:   no involuntary movements noted. Tone:    Appears normal  Posture:    Posture is normal. normal erect    Strength:    Strength is anti-gravity and symmetric in the upper and lower limbs.      Sensation: intact to LT        Assessment/Plan:  Very nice 71 year old patient with short-term memory loss, and FHx of dementia. May be normal cognitive aging but needs evaluation for prodromal alzheimer's disease. MRI of the brain for reversible causes of dementia and bloodwork unremarkable. Formal memory testing diagnosed with Mild late onset Alzheimer's dementia without behavioral disturbance, psychotic disturbance, mood disturbance, or anxiety (HCC)  Take 5mg  Donepezil in morning instead due to nightmares Start 5mg  Memantine twice a day Hold off on more testing for Lecanemab Injection - will discuss at next appointment  - We discussed diagnosis. MRi of the brain and labwork unremarkable (rebiewed images), we discussed what to do next and lecanumab  - Start Aricept/Donepezil 5mg  - do not increase, has loose stools, if resolves mychart me - start Memantine - The new medication is Lecanumab: memory.[email or web address] : This medication is in its early stages in clinical use recently approved and we can discuss at office appointment. See link above and lots of information on the internet - follow up in 3 months   Cc: Encinal Nation, MD,  Parksley Nation, MD  Sarina Ill, MD  [facility] [address] [place], [place] [address]  Phone [phone] Fax [phone]

## 2022-05-01 NOTE — Patient Instructions (Addendum)
Take 5mg  Donepezil in morning instead Start 5mg  Memantine twice a day Hold off on more testing for Lecanemab Injection   What is this medication? LECANEMAB (lek AN e mab) treats Alzheimer disease. It works by decreasing the buildup of amyloid, a protein that may cause Alzheimer disease. This may slow down the worsening of symptoms. It is a monoclonal antibody. This medicine may be used for other purposes; ask your health care provider or pharmacist if you have questions. COMMON BRAND NAME(S): LEQEMBI What should I tell my care team before I take this medication? They need to know if you have any of these conditions: An unusual or allergic reaction to lecanemab, other medications, foods, dyes, or preservatives Take medications that treat or prevent blood clots Pregnant or trying to get pregnant Breast-feeding How should I use this medication? This medication is injected into a vein. It is given by your care team in a hospital or clinic setting. A special MedGuide will be given to you before each treatment. Be sure to read this information carefully each time. Talk to your care team about the use of this medication in children. Special care may be needed. Overdosage: If you think you have taken too much of this medicine contact a poison control center or emergency room at once. NOTE: This medicine is only for you. Do not share this medicine with others. What if I miss a dose? Keep appointments for follow-up doses. It is important not to miss your dose. Call your care team if you are unable to keep an appointment. What may interact with this medication? Interactions have not been studied. This list may not describe all possible interactions. Give your health care provider a list of all the medicines, herbs, non-prescription drugs, or dietary supplements you use. Also tell them if you smoke, drink alcohol, or use illegal drugs. Some items may interact with your medicine. What should I watch for  while using this medication? Visit your care team for regular checks on your progress. Tell your care team if your symptoms do not start to get better or if they get worse. What side effects may I notice from receiving this medication? Side effects that you should report to your care team as soon as possible: Allergic reactions or angioedema--skin rash, itching or hives, swelling of the face, eyes, lips, tongue, arms, or legs, trouble swallowing or breathing Headache, worsening confusion, dizziness, change in vision, nausea, seizures Infusion reactions--chest pain, shortness of breath or trouble breathing, feeling faint or lightheaded Side effects that usually do not require medical attention (report these to your care team if they continue or are bothersome): Cough Diarrhea Headache This list may not describe all possible side effects. Call your doctor for medical advice about side effects. You may report side effects to FDA at 1-800-FDA-1088. Where should I keep my medication? This medication is given in a hospital or clinic. It will not be stored at home. NOTE: This sheet is a summary. It may not cover all possible information. If you have questions about this medicine, talk to your doctor, pharmacist, or health care provider.  2023 Elsevier/Gold Standard (2021-07-16 00:00:00)   Memantine Tablets What is this medication? MEMANTINE (MEM an teen) treats memory loss and confusion (dementia) in people who have Alzheimer disease. It works by improving attention, memory, and the ability to engage in daily activities. It is not a cure for dementia or Alzheimer disease. This medicine may be used for other purposes; ask your health care provider or  pharmacist if you have questions. COMMON BRAND NAME(S): Namenda What should I tell my care team before I take this medication? They need to know if you have any of these conditions: Kidney disease Liver disease Seizures Trouble passing urine An  unusual or allergic reaction to memantine, other medications, foods, dyes, or preservatives Pregnant or trying to get pregnant Breast-feeding How should I use this medication? Take this medication by mouth with water. Follow the directions on the prescription label. You may take this medication with or without food. Take your doses at regular intervals. Do not take your medication more often than directed. Continue to take your medication even if you feel better. Do not stop taking except on the advice of your care team. Talk to your care team about the use of this medication in children. Special care may be needed Overdosage: If you think you have taken too much of this medicine contact a poison control center or emergency room at once. NOTE: This medicine is only for you. Do not share this medicine with others. What if I miss a dose? If you miss a dose, take it as soon as you can. If it is almost time for your next dose, take only that dose. Do not take double or extra doses. If you do not take your medication for several days, contact your care team. Your dose may need to be changed. What may interact with this medication? Acetazolamide Amantadine Cimetidine Dextromethorphan Dofetilide Hydrochlorothiazide Ketamine Metformin Methazolamide Quinidine Ranitidine Sodium bicarbonate Triamterene This list may not describe all possible interactions. Give your health care provider a list of all the medicines, herbs, non-prescription drugs, or dietary supplements you use. Also tell them if you smoke, drink alcohol, or use illegal drugs. Some items may interact with your medicine. What should I watch for while using this medication? Visit your care team for regular checks on your progress. Check with your care team if there is no improvement in your symptoms or if they get worse. This medication may affect your coordination, reaction time, or judgment. Do not drive or operate machinery until you  know how this medication affects you. Sit up or stand slowly to reduce the risk of dizzy or fainting spells. Drinking alcohol with this medication can increase the risk of these side effects. What side effects may I notice from receiving this medication? Side effects that you should report to your care team as soon as possible: Allergic reactions--skin rash, itching, hives, swelling of the face, lips, tongue, or throat Side effects that usually do not require medical attention (report to your care team if they continue or are bothersome): Confusion Constipation Diarrhea Dizziness Headache This list may not describe all possible side effects. Call your doctor for medical advice about side effects. You may report side effects to FDA at 1-800-FDA-1088. Where should I keep my medication? Keep out of the reach of children. Store at room temperature between 15 degrees and 30 degrees C (59 degrees and 86 degrees F). Throw away any unused medication after the expiration date. NOTE: This sheet is a summary. It may not cover all possible information. If you have questions about this medicine, talk to your doctor, pharmacist, or health care provider.  2023 Elsevier/Gold Standard (2021-07-04 00:00:00)

## 2022-05-01 NOTE — Telephone Encounter (Signed)
LVM asking pt to call back to schedule f/u 

## 2022-05-01 NOTE — Telephone Encounter (Signed)
Please schedule f/u with dr Jaynee Eagles 3 months

## 2022-06-06 DIAGNOSIS — I10 Essential (primary) hypertension: Secondary | ICD-10-CM | POA: Diagnosis not present

## 2022-06-06 DIAGNOSIS — E7849 Other hyperlipidemia: Secondary | ICD-10-CM | POA: Diagnosis not present

## 2022-06-06 DIAGNOSIS — E1169 Type 2 diabetes mellitus with other specified complication: Secondary | ICD-10-CM | POA: Diagnosis not present

## 2022-06-06 DIAGNOSIS — R413 Other amnesia: Secondary | ICD-10-CM | POA: Diagnosis not present

## 2022-06-06 DIAGNOSIS — Z6824 Body mass index (BMI) 24.0-24.9, adult: Secondary | ICD-10-CM | POA: Diagnosis not present

## 2022-06-27 DIAGNOSIS — J329 Chronic sinusitis, unspecified: Secondary | ICD-10-CM | POA: Diagnosis not present

## 2022-06-27 DIAGNOSIS — Z6823 Body mass index (BMI) 23.0-23.9, adult: Secondary | ICD-10-CM | POA: Diagnosis not present

## 2022-06-27 DIAGNOSIS — R059 Cough, unspecified: Secondary | ICD-10-CM | POA: Diagnosis not present

## 2022-07-24 DIAGNOSIS — Z6824 Body mass index (BMI) 24.0-24.9, adult: Secondary | ICD-10-CM | POA: Diagnosis not present

## 2022-07-24 DIAGNOSIS — H8110 Benign paroxysmal vertigo, unspecified ear: Secondary | ICD-10-CM | POA: Diagnosis not present

## 2022-09-15 ENCOUNTER — Ambulatory Visit: Payer: Medicare Other | Admitting: Psychology

## 2022-10-08 IMAGING — MR MR HEAD WO/W CM
13 of 14 series · 38 of 48 positions shown · IV contrast (7.5 ml Gadavist)
Comparison: None.

CLINICAL DATA: Memory loss

EXAM:
MRI HEAD WITHOUT AND WITH CONTRAST
TECHNIQUE: Multiplanar, multiecho pulse sequences of the brain and surrounding
structures were obtained without and with intravenous contrast.
CONTRAST:  7.5mL GADAVIST GADOBUTROL 1 MMOL/ML IV SOLN

[Series 5: DWI · axial · 3.0mm · 0.77mm/px · z∈[-55,+91]mm · 3 of 50 slices shown (1 of 4)]
[im 1/50]
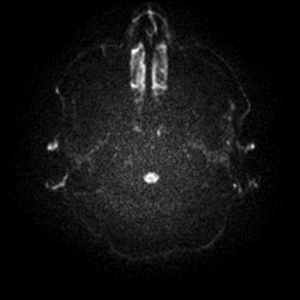
[im 25/50]
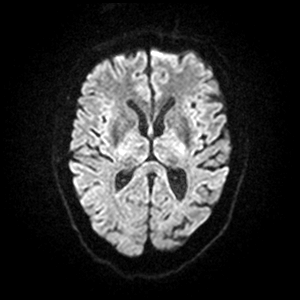
[im 50/50]
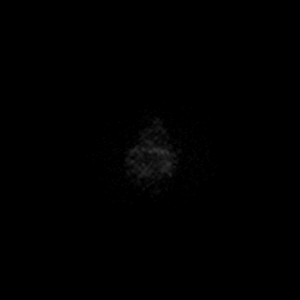

[Series 6: DWI · axial · 3.0mm · 0.77mm/px · z∈[-55,+91]mm · 3 of 50 slices shown (2 of 4)]
[im 1/50]
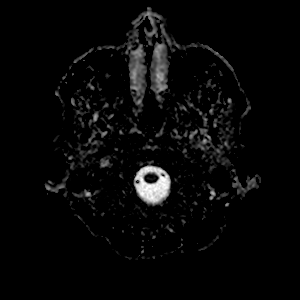
[im 25/50]
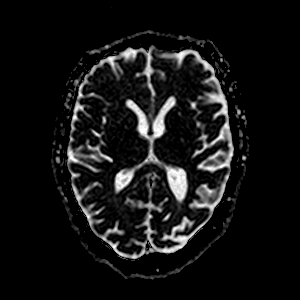
[im 50/50]
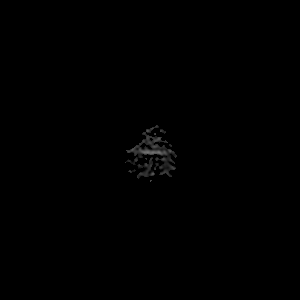

[Series 7: DWI · coronal · 5.0mm · 0.88mm/px · 2 of 28 slices shown (3 of 4)]
[im 1/28]
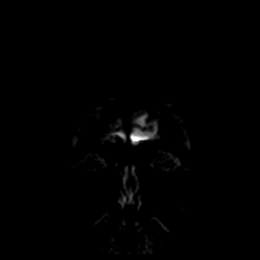
[im 28/28]
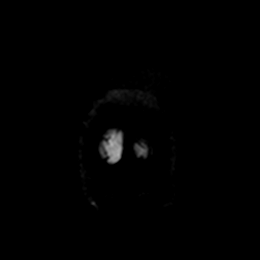

[Series 8: DWI · coronal · 5.0mm · 0.88mm/px · 2 of 28 slices shown (4 of 4)]
[im 1/28]
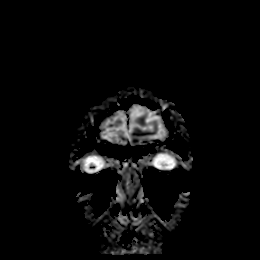
[im 28/28]
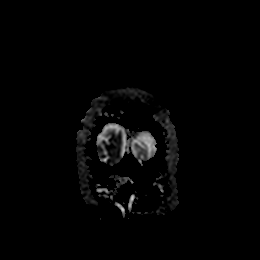

[Series 9: T1 · sagittal · 5.0mm · 0.75mm/px · 1 of 21 slices shown (1 of 2)]
[im 1/21]
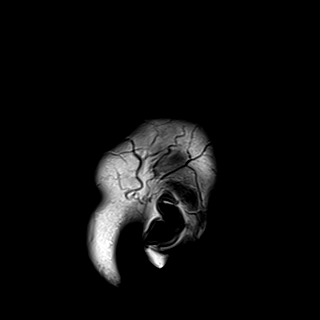

[Series 10: T2 · axial · 5.0mm · 0.72mm/px · 1 of 23 slices shown (1 of 2)]
[im 1/23]
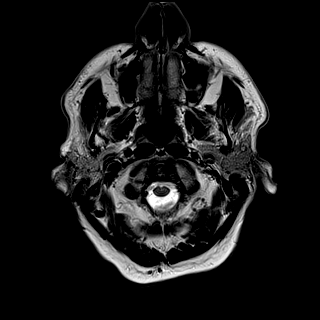

[Series 11: mag_images · axial · 3.0mm · 0.90mm/px · z∈[-71,+106]mm · 3 of 60 slices shown]
[im 1/60]
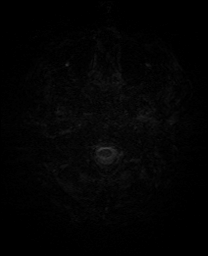
[im 30/60]
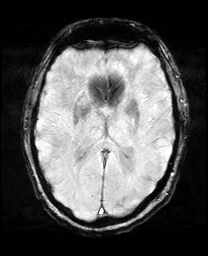
[im 60/60]
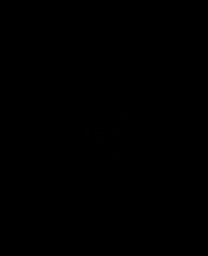

[Series 12: pha_images · axial · 3.0mm · 0.90mm/px · z∈[-71,+106]mm · 3 of 57 slices shown]
[im 1/57]
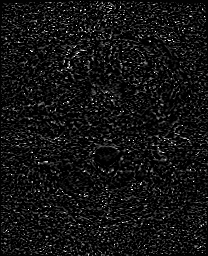
[im 29/57]
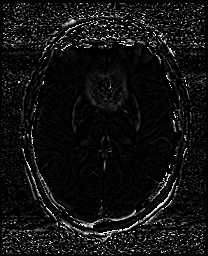
[im 57/57]
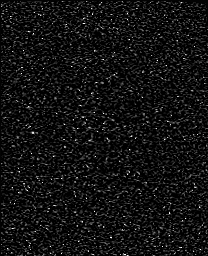

[Series 13: swi_images · axial · 3.0mm · 0.90mm/px · z∈[-71,+106]mm · 3 of 60 slices shown]
[im 1/60]
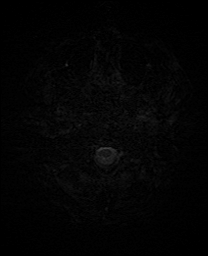
[im 30/60]
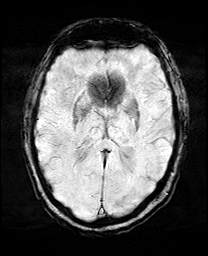
[im 60/60]
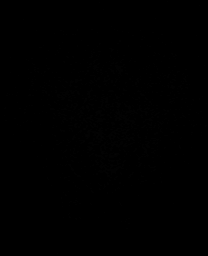

[Series 15: FLAIR · axial · 3.0mm · 0.45mm/px · z∈[-56,+91]mm · 3 of 50 slices shown]
[im 1/50]
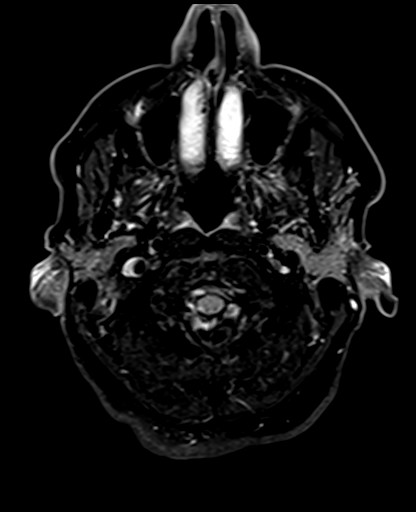
[im 25/50]
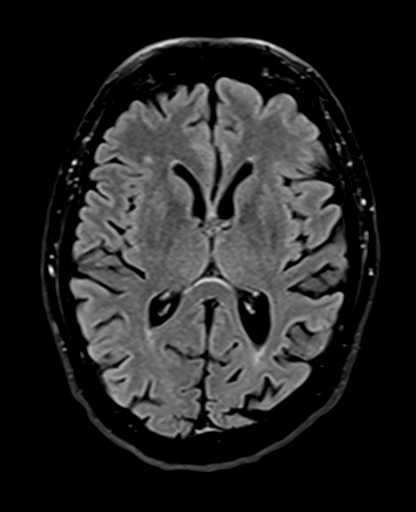
[im 50/50]
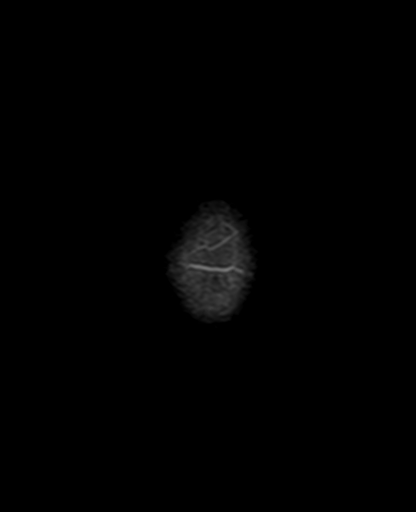

[Series 16: T1 · axial · 1.0mm · 0.98mm/px · z∈[-70,+100]mm · 10 of 168 slices shown (2 of 2)]
[im 1/168]
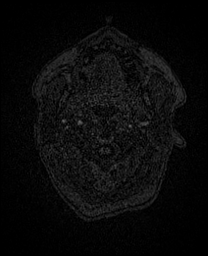
[im 19/168]
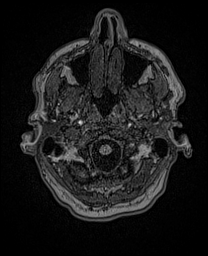
[im 38/168]
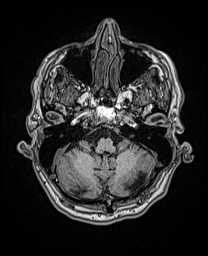
[im 56/168]
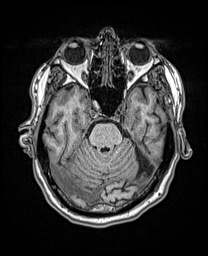
[im 75/168]
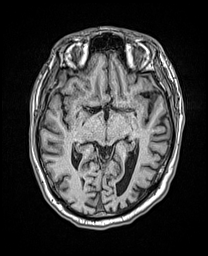
[im 93/168]
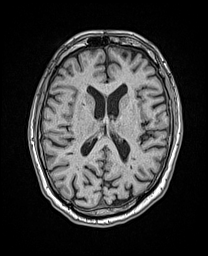
[im 112/168]
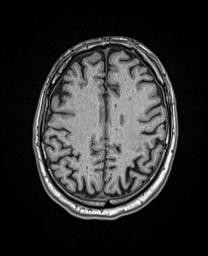
[im 130/168]
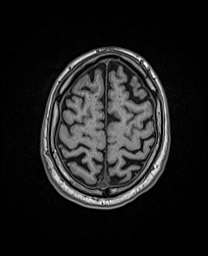
[im 149/168]
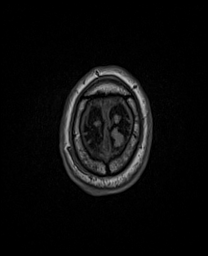
[im 168/168]
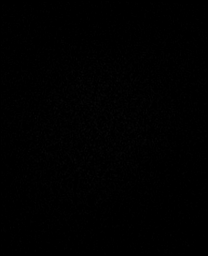

[Series 17: T2 · coronal · 5.0mm · 0.72mm/px · 2 of 28 slices shown (2 of 2)]
[im 1/28]
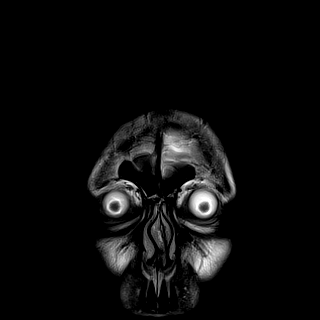
[im 28/28]
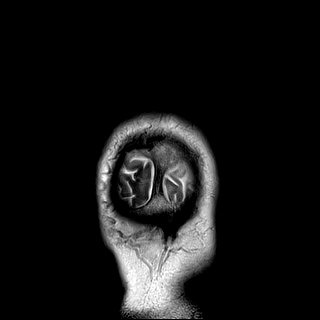

[Series 19: T1 post-contrast · coronal · 5.0mm · 0.34mm/px · 2 of 28 slices shown]
[im 1/28]
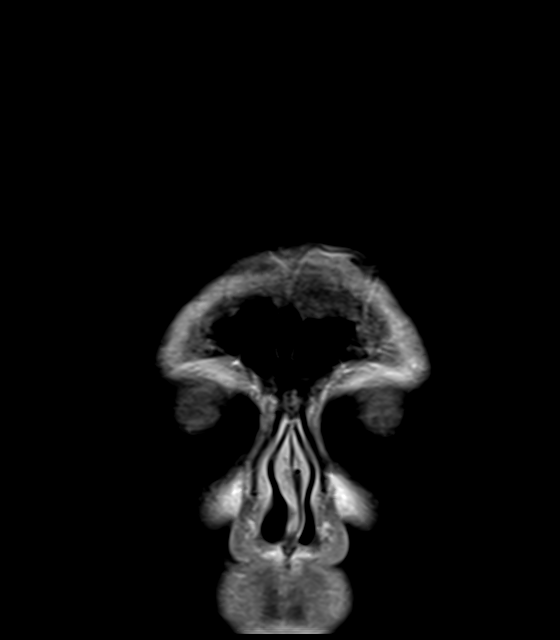
[im 28/28]
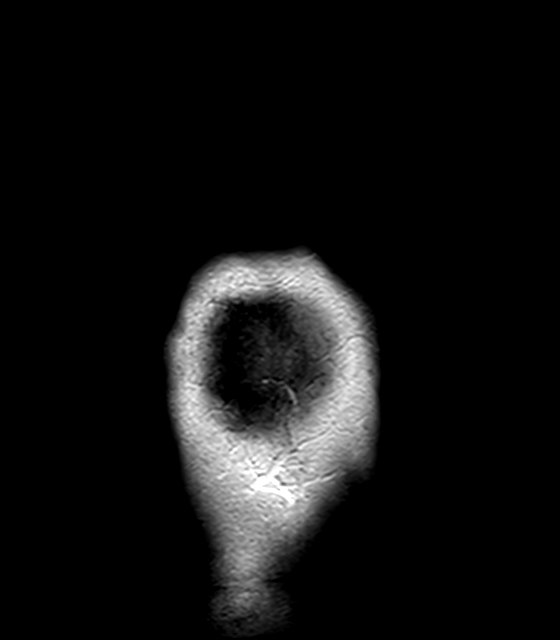

[38 of 48 positions shown; findings below may reference images not displayed]

FINDINGS: Brain: No acute infarct, mass effect or extra-axial collection. No
acute or chronic hemorrhage. Normal white matter signal, parenchymal
volume and CSF spaces. The midline structures are normal. There is
no abnormal contrast enhancement.

Vascular: Major flow voids are preserved.

Skull and upper cervical spine: Normal calvarium and skull base.
Visualized upper cervical spine and soft tissues are normal.

Sinuses/Orbits:No paranasal sinus fluid levels or advanced mucosal
thickening. No mastoid or middle ear effusion. Normal orbits.
IMPRESSION: Normal aging brain

## 2022-11-26 ENCOUNTER — Other Ambulatory Visit: Payer: Self-pay | Admitting: Neurology

## 2022-12-19 DIAGNOSIS — H524 Presbyopia: Secondary | ICD-10-CM | POA: Diagnosis not present

## 2022-12-19 DIAGNOSIS — H40053 Ocular hypertension, bilateral: Secondary | ICD-10-CM | POA: Diagnosis not present

## 2022-12-24 ENCOUNTER — Other Ambulatory Visit: Payer: Self-pay | Admitting: Neurology

## 2022-12-24 NOTE — Telephone Encounter (Signed)
Needs follow up appt.  3 mon f/u from 04-2022, has not called to set up

## 2023-01-02 ENCOUNTER — Other Ambulatory Visit: Payer: Self-pay | Admitting: Neurology

## 2023-01-12 NOTE — Telephone Encounter (Signed)
  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 Having transportation issues. Last two visit in 2023 were MyChart visits. Pt would like a call back.

## 2023-01-12 NOTE — Telephone Encounter (Signed)
1ST ATTEMPT LVM Left msg needs to call back to schedule appt to receive refill on memory medication

## 2023-01-13 ENCOUNTER — Telehealth: Payer: Self-pay

## 2023-01-13 MED ORDER — MEMANTINE HCL 5 MG PO TABS: 5.0000 mg | ORAL_TABLET | Freq: Two times a day (BID) | ORAL | 3 refills | Status: DC

## 2023-01-13 MED ORDER — DONEPEZIL HCL 5 MG PO TABS: 5.0000 mg | ORAL_TABLET | Freq: Every morning | ORAL | 3 refills | Status: DC

## 2023-01-13 NOTE — Telephone Encounter (Signed)
 Refill sent.

## 2023-01-13 NOTE — Telephone Encounter (Signed)
ROUTING TO PHONE TOOM TO CALL TO SCHEDULE: Pt needs to be scheuled for an appt prior to obtaining refills of aricept and namenda.

## 2023-01-22 NOTE — Telephone Encounter (Signed)
He was called and I sent refill in a later telephone encounter: Scheduled for November  Aricept 5mg  every day Take 1 tablet (5 mg total) by mouth in the morning. Sent on 01/13/23 1 month with 3 refills enough to get him to appt   Namenda 5mg  bid sent 01/13/23 1 month with 3 refills.

## 2023-02-03 DIAGNOSIS — H25093 Other age-related incipient cataract, bilateral: Secondary | ICD-10-CM | POA: Diagnosis not present

## 2023-02-03 DIAGNOSIS — H40063 Primary angle closure without glaucoma damage, bilateral: Secondary | ICD-10-CM | POA: Diagnosis not present

## 2023-02-26 DIAGNOSIS — Z0000 Encounter for general adult medical examination without abnormal findings: Secondary | ICD-10-CM | POA: Diagnosis not present

## 2023-02-26 DIAGNOSIS — Z1329 Encounter for screening for other suspected endocrine disorder: Secondary | ICD-10-CM | POA: Diagnosis not present

## 2023-02-26 DIAGNOSIS — E1169 Type 2 diabetes mellitus with other specified complication: Secondary | ICD-10-CM | POA: Diagnosis not present

## 2023-02-26 DIAGNOSIS — E7849 Other hyperlipidemia: Secondary | ICD-10-CM | POA: Diagnosis not present

## 2023-02-26 DIAGNOSIS — Z13220 Encounter for screening for lipoid disorders: Secondary | ICD-10-CM | POA: Diagnosis not present

## 2023-02-26 DIAGNOSIS — E559 Vitamin D deficiency, unspecified: Secondary | ICD-10-CM | POA: Diagnosis not present

## 2023-02-26 DIAGNOSIS — E1165 Type 2 diabetes mellitus with hyperglycemia: Secondary | ICD-10-CM | POA: Diagnosis not present

## 2023-03-04 DIAGNOSIS — Z23 Encounter for immunization: Secondary | ICD-10-CM | POA: Diagnosis not present

## 2023-03-04 DIAGNOSIS — Z0000 Encounter for general adult medical examination without abnormal findings: Secondary | ICD-10-CM | POA: Diagnosis not present

## 2023-03-04 DIAGNOSIS — Z6824 Body mass index (BMI) 24.0-24.9, adult: Secondary | ICD-10-CM | POA: Diagnosis not present

## 2023-03-04 DIAGNOSIS — E7849 Other hyperlipidemia: Secondary | ICD-10-CM | POA: Diagnosis not present

## 2023-03-06 DIAGNOSIS — Z0001 Encounter for general adult medical examination with abnormal findings: Secondary | ICD-10-CM | POA: Diagnosis not present

## 2023-03-09 ENCOUNTER — Encounter: Payer: Self-pay | Admitting: *Deleted

## 2023-03-11 DIAGNOSIS — H40063 Primary angle closure without glaucoma damage, bilateral: Secondary | ICD-10-CM | POA: Diagnosis not present

## 2023-03-11 DIAGNOSIS — H25093 Other age-related incipient cataract, bilateral: Secondary | ICD-10-CM | POA: Diagnosis not present

## 2023-03-13 ENCOUNTER — Other Ambulatory Visit: Payer: Self-pay | Admitting: Optometry

## 2023-03-13 DIAGNOSIS — H53461 Homonymous bilateral field defects, right side: Secondary | ICD-10-CM

## 2023-03-13 NOTE — Progress Notes (Signed)
Right homonymous Hemianopsia. Unknown duration. Will order MRI brain.

## 2023-04-06 ENCOUNTER — Ambulatory Visit (HOSPITAL_COMMUNITY)
Admission: RE | Admit: 2023-04-06 | Discharge: 2023-04-06 | Disposition: A | Discharge status: Home / Self Care | Payer: Medicare Other | Source: Ambulatory Visit | Attending: Optometry | Admitting: Optometry

## 2023-04-06 DIAGNOSIS — G319 Degenerative disease of nervous system, unspecified: Secondary | ICD-10-CM | POA: Diagnosis not present

## 2023-04-06 DIAGNOSIS — H5347 Heteronymous bilateral field defects: Secondary | ICD-10-CM | POA: Diagnosis not present

## 2023-04-06 DIAGNOSIS — H53461 Homonymous bilateral field defects, right side: Secondary | ICD-10-CM | POA: Diagnosis not present

## 2023-04-06 DIAGNOSIS — H547 Unspecified visual loss: Secondary | ICD-10-CM | POA: Diagnosis not present

## 2023-04-06 MED ORDER — GADOBUTROL 1 MMOL/ML IV SOLN
7.0000 mL | Freq: Once | INTRAVENOUS | Status: AC | PRN
  Administered 2023-04-06: 7 mL via INTRAVENOUS

## 2023-04-11 ENCOUNTER — Other Ambulatory Visit: Payer: Self-pay | Admitting: Neurology

## 2023-04-22 DIAGNOSIS — H40063 Primary angle closure without glaucoma damage, bilateral: Secondary | ICD-10-CM | POA: Diagnosis not present

## 2023-04-22 DIAGNOSIS — H25093 Other age-related incipient cataract, bilateral: Secondary | ICD-10-CM | POA: Diagnosis not present

## 2023-04-22 DIAGNOSIS — H2513 Age-related nuclear cataract, bilateral: Secondary | ICD-10-CM | POA: Diagnosis not present

## 2023-05-12 ENCOUNTER — Ambulatory Visit: Payer: Medicare Other | Admitting: Neurology

## 2023-05-12 ENCOUNTER — Encounter: Payer: Self-pay | Admitting: Neurology

## 2023-05-12 VITALS — BP 136/77 | HR 51 | Ht 69.0 in | Wt 167.8 lb

## 2023-05-12 DIAGNOSIS — F02A0 Dementia in other diseases classified elsewhere, mild, without behavioral disturbance, psychotic disturbance, mood disturbance, and anxiety: Secondary | ICD-10-CM

## 2023-05-12 DIAGNOSIS — G301 Alzheimer's disease with late onset: Secondary | ICD-10-CM

## 2023-05-12 MED ORDER — MEMANTINE HCL 10 MG PO TABS: 10.0000 mg | ORAL_TABLET | Freq: Two times a day (BID) | ORAL | 3 refills | Status: AC

## 2023-05-12 NOTE — Patient Instructions (Addendum)
Do not want to increase Aricept as that can cause lowered heart rate and your pulse is very low already. Continue 5mg  in morning To help with memory I would increase Memantine to 10mg  twice daily For frustration and possibly a little depression, may consider SSRI such as Zoloft would discuss with Roma Kayser or can mychart me  Not interested in [place] or Donanemab at this time. Recommend not driving Follow up with primary care   Memantine Tablets What is this medication? MEMANTINE (MEM an teen) treats memory loss and confusion (dementia) in people who have Alzheimer disease. It works by improving attention, memory, and the ability to engage in daily activities. It is not a cure for dementia or Alzheimer disease. This medicine may be used for other purposes; ask your health care provider or pharmacist if you have questions. COMMON BRAND NAME(S): Namenda What should I tell my care team before I take this medication? They need to know if you have any of these conditions: Kidney disease Liver disease Seizures Trouble passing urine An unusual or allergic reaction to memantine, other medications, foods, dyes, or preservatives Pregnant or trying to get pregnant Breast-feeding How should I use this medication? Take this medication by mouth with water. Follow the directions on the prescription label. You may take this medication with or without food. Take your doses at regular intervals. Do not take your medication more often than directed. Continue to take your medication even if you feel better. Do not stop taking except on the advice of your care team. Talk to your care team about the use of this medication in children. Special care may be needed Overdosage: If you think you have taken too much of this medicine contact a poison control center or emergency room at once. NOTE: This medicine is only for you. Do not share this medicine with others. What if I miss a dose? If you miss a dose,  take it as soon as you can. If it is almost time for your next dose, take only that dose. Do not take double or extra doses. If you do not take your medication for several days, contact your care team. Your dose may need to be changed. What may interact with this medication? Acetazolamide Amantadine Cimetidine Dextromethorphan Dofetilide Hydrochlorothiazide Ketamine Metformin Methazolamide Quinidine Ranitidine Sodium bicarbonate Triamterene This list may not describe all possible interactions. Give your health care provider a list of all the medicines, herbs, non-prescription drugs, or dietary supplements you use. Also tell them if you smoke, drink alcohol, or use illegal drugs. Some items may interact with your medicine. What should I watch for while using this medication? Visit your care team for regular checks on your progress. Check with your care team if there is no improvement in your symptoms or if they get worse. This medication may affect your coordination, reaction time, or judgment. Do not drive or operate machinery until you know how this medication affects you. Sit up or stand slowly to reduce the risk of dizzy or fainting spells. Drinking alcohol with this medication can increase the risk of these side effects. What side effects may I notice from receiving this medication? Side effects that you should report to your care team as soon as possible: Allergic reactions--skin rash, itching, hives, swelling of the face, lips, tongue, or throat Side effects that usually do not require medical attention (report to your care team if they continue or are bothersome): Confusion Constipation Diarrhea Dizziness Headache This list may not describe all  possible side effects. Call your doctor for medical advice about side effects. You may report side effects to FDA at 1-800-FDA-1088. Where should I keep my medication? Keep out of the reach of children. Store at room temperature between  15 degrees and 30 degrees C (59 degrees and 86 degrees F). Throw away any unused medication after the expiration date. NOTE: This sheet is a summary. It may not cover all possible information. If you have questions about this medicine, talk to your doctor, pharmacist, or health care provider.  2024 Elsevier/Gold Standard (2021-07-09 00:00:00)  Alzheimer's Disease Alzheimer's disease is a disease that affects the way your brain works. It affects memory and causes changes in how you think, talk, and act. The disease gets worse over time. Alzheimer's disease is a form of dementia. What are the causes? Alzheimer's disease happens when a protein called beta-amyloid forms deposits in the brain. It's not known what causes these to form. The disease may also be caused by a harmful change in a gene that's passed down, or inherited, from one or both parents. Not everyone who gets the changed gene from their parents will get the disease. What increases the risk? Being older than 74 years of age. Being male. Having any of these conditions: High blood pressure. Diabetes. Heart or blood vessel disease. Smoking. Being very overweight. Having a brain injury or a stroke in the past. Having a family history of dementia. What are the signs or symptoms?  Symptoms may happen in three stages, which often overlap. Early stage In this stage, you can still do things on your own. You may still be able to drive, work, and be social. Symptoms in this stage include: Forgetting small things, like a name, words, or what you did recently. Having a hard time with: Paying attention or learning new things. Talking with people. Doing your usual tasks. Solving problems or doing math. Following instructions. Feeling worried or nervous. Not wanting to be around people. Losing interest in doing things. Moderate stage In this stage, you'll start to need care. Symptoms include: Trouble saying what you're  thinking. Memory loss that affects daily life. This can include forgetting: Things that happened recently. If you've taken medicines or eaten. Places you know. You may get lost while walking or driving. To pay bills. To bathe or use the bathroom. Being confused about where you are or what time it is. Trouble judging distance. Changes in how you feel or act. You may be moody, angry, upset, scared, worried, or suspicious. Not thinking clearly or making good choices. You may have delusions or false beliefs. Hallucinations. This means you see, hear, taste, smell, or feel things that aren't real. Severe stage In the severe stage, you'll need help with your personal care and daily activities. Symptoms include: Memory loss getting worse. Personality changes. Not knowing where you are. Physical problems, like trouble walking, sitting, or swallowing. More trouble talking with others. Not being able to control when you pee (urinate) or poop. More changes in how you act. How is this diagnosed? You may need to see a nervous system specialist, called a neurologist, or a health care provider who focuses on the care of older adults. Alzheimer's disease may be diagnosed based on: Your symptoms and medical history. Your provider will talk with you and your family, friends, and caregivers about your history and symptoms. A physical exam. Tests. These may include: Lab tests, such as tests on your blood or pee. Imaging tests. You may have a  CT scan, a PET scan, or an MRI. A lumbar puncture. For this test, a sample of the fluid around the brain and spinal cord is taken and tested. Tests to check your thinking and memory. Genetic testing. This may be done if you get the disease before age 65 or if other family members have had the disease. How is this treated? At this time, there's no cure for Alzheimer's disease. The goals of treatment are to: Manage symptoms that affect behavior. Make sure you're safe  at home. Help manage daily life for you and your caregivers. Treatment may include: Medicines. You may get medicines that can: Slow down how fast the disease gets worse. Help with memory and behavior. Cognitive therapy. This gives you support, training to help with thinking skills, and memory aids. Counseling or spiritual guidance. These can help you deal with the many feelings you may have, such as fear, anger, or feeling alone. Caregivers. These are people who help you with your daily tasks. Family support groups. These allow your family members to learn about the disease, get emotional support, and find out about resources to help take care of you. Follow these instructions at home:  Medicines Take over-the-counter and prescription medicines only as told by your provider. Use a pill organizer or pill reminder to help you keep track of your medicines. Avoid taking medicines that can affect thinking, such as medicines for pain or sleep. Lifestyle Make healthy choices: Be active as told by your provider. Regular exercise may help with symptoms. Do not smoke, vape, or use products with nicotine or tobacco in them. Do not drink alcohol. Eat a healthy diet. When you feel a lot of stress, do something that helps you relax. Try things like yoga or deep breathing. Spend time with people. Drink enough fluid to keep your pee pale yellow. Make sure you sleep well. These tips can help: Try not to take long naps during the day. Take short naps of 30 minutes or less if needed. Keep your bedroom dark and cool. Do not exercise during the few hours before you go to bed. Avoid caffeine products in the afternoon and evening. General instructions Work with your provider to decide: What things you need help with. What your safety needs are. If you were given a bracelet that tracks where you are and shows that you're a person with memory loss, make sure you wear it at all times. Talk with your  provider about whether it's safe for you to drive. Work with your family to make big legal or health decisions. This may include things like advance directives, medical power of attorney, or a living will. Where to find more information There are two ways to contact the Alzheimer's Association: Call the 24-hour helpline at 712-188-0046. Visit WesternTunes.it Contact a health care provider if: Your medicine causes you to have nausea, vomiting, or trouble with eating. You have mood or behavior changes that are getting worse, such as feeling depressed, worried, or nervous. You have hallucinations. You or your family members are worried about your safety. You're hard to wake up. Your memory suddenly gets worse. Get help right away if: You feel like you may hurt yourself or others. You have thoughts about taking your own life. Take one of these steps: Go to your nearest emergency room. Call 911. Call the National Suicide Prevention Lifeline at 727-197-1857 or 988. Text the Crisis Text Line at 262-243-0533. This information is not intended to replace advice given to you by  your health care provider. Make sure you discuss any questions you have with your health care provider. Document Revised: 09/16/2022 Document Reviewed: 09/16/2022 Elsevier Patient Education  2024 Elsevier Inc.  Recommendations to prevent or slow progression of cognitive decline:   Exercise You should increase exercise 30 to 45 minutes per day at least 3 days a week although 5 to 7 would be preferred. Any type of exercise (including walking) is acceptable although a recumbent bicycle may be best if you are unsteady. Disease related apathy can be a significant roadblock to exercise and the only way to overcome this is to make it a daily routine and perhaps have a reward at the end (something your loved one loves to eat or drink perhaps) or a personal trainer coming to the home can also be very useful. In general a structured, repetitive  schedule is best.   Cardiovascular Health: You should optimize all cardiovascular risk factors (blood pressure, sugar, cholesterol) as vascular disease such as strokes and heart attacks can make memory problems much worse.   Diet: Eating a heart healthy (Mediterranean) diet is also a good idea; fish and poultry instead of red meat, nuts (mostly non-peanuts), vegetables, fruits, olive oil or canola oil (instead of butter), minimal salt (use other spices to flavor foods), whole grain rice, bread, cereal and pasta and wine in moderation.  General Health: Any diseases which effect your body will effect your brain such as a pneumonia, urinary infection, blood clot, heart attack or stroke. Keep contact with your primary care doctor for regular follow ups.  Sleep. A good nights sleep is healthy for the brain. Seven hours is recommended. If you have insomnia or poor sleep habits see the recommendations below  Tips: Structured and consistent daytime and nighttime routine, including regular wake times, bedtimes, and mealtimes, will be important for the patient to avoid confusion. Keeping frequently used items in designated places will help reduce stress from searching. If there are worries about getting lost do not let the patient leave home unaccompanied. They might benefit from wearing an identification bracelet that will help others assist in finding home if they become lost. Information about nationwide safe return services and other helpful resources may be obtained through the Alzheimer's Association helpline at [phone].  Finances, Power of Restaurant manager, fast food Directives: You should consider putting legal safeguards in place with regard to financial and medical decision making. While the spouse always has power of attorney for medical and financial issues in the absence of any form, you should consider what you want in case the spouse / caregiver is no longer around or capable of making decisions.    [place] : MovieEvening.com.au.pdf  Or Google "[facility]" AND "An Proofreader for [employer]  Other States: PainBasics.com.au  The signature on these forms should be notarized.   DRIVING:   Driving only during the day Drive only to familiar Locations Avoid driving during bad weather  If you would like to be tested to see if you are driving safely, [facility] has a Clinical Driving Evaluation. To schedule an appointment call [phone].                RESOURCES:  Memory Loss: Improve your short term memory By Laverle Patter  The Alzheimer's Reading Room [email or web address]   The Alzheimer's Compendium http://www.alzcompend.info/  [employer] www.dukefamilysupport.MWU [phone]  Recommended resources for caregivers (All can be purchased on Amazon):  1) A Caregiver's Guide to Dementia: Using Activities and Other Strategies to  Prevent, Reduce and Manage Behavioral Symptoms by Mahala Menghini. Bethena Midget and [employer]   2) A Caregiver's Guide to Owens & Minor Dementia by Briant Sites MS BSN and Velia Meyer   3) What If It's Not Alzheimer's?: A Caregiver's Guide to Dementia by Neila Gear (Author), Roberts Gaudy (Editor)  3) The 36 hour day by Rabins and Mace  4) Understanding Difficult Behaviors by Gerre Scull and White  Online course for helping caregivers reduce stress, guilt and frustration called the Caregivers Helpbook. The website is www.powerfultoolsforcaregivers.org  As a caregiver you are a Government social research officer. Problems you face as a caregiver are usually unique to your situation and the way your loved-one's disease manifests itself. The best way to use these books is to look at the Table of Contents and read any chapters of interest or that apply to challenges you are  having as a caregiver.  NATIONAL RESOURCES: For more information on neurological disorders or research programs funded by the [employer] of Neurological Disorders and Stroke, contact the Institute's Gaffer (BRAIN) at: BRAIN [address] [place] [address] [phone] (toll-free) ToledoAutomobile.co.uk  Information on dementia is also available from the following organizations: Alzheimer's Disease Education and Referral (ADEAR) Center [employer] on Aging [address] [place] [address] [phone] (toll-free) CashCowGambling.be  Alzheimer's Association [address] [place], [place] [address] [phone] (toll-free, 24-hour helpline) [phone] (TDD) LimitLaws.hu  Alzheimer's Foundation of [place] [address] [place], [place] [address] [phone] (toll-free) www.alzfdn.org  Alzheimer's Drug Discovery Foundation [address] [place], [place] [address] [phone] www.alzdiscovery.org  Association for Frontotemporal Degeneration [employer] [address] [place], [place] [address] [phone] (toll-free) www.theaftd.[facility]  [facility] [facility] [address] [place], [place] [address] [phone] (toll-free) www.brightfocus.[email or web address]/alzheimers  [facility] [address] [place], [place] [address] [phone] www.BushWebsites.nl  Lewy Body Dementia Association [address], [place], [place] [address] [phone] (toll-free LBD Caregiver Link) www.lbda.[facility] of [facility] [address] [place], [place] [address] [phone] (toll-free) [phone] [facility]) http://www.maynard.net/  National Organization for Rare Disorders [address] [place], [place] [address] 3-267-124-PYKD [phone])  (toll-free) www.rarediseases.org  The Dementias: Hope Through Research was jointly produced by the [employer] of Neurological Disorders and Stroke (NINDS) and the [employer] on Aging (NIA), both part of the [employer], the [employer] research agency--supporting scientific studies that turn discovery into health. NINDS is the nation's leading funder of research on the brain and nervous system. The NINDS mission is to reduce the burden of neurological disease. For more information and resources, visit ToledoAutomobile.co.uk [1] or call ([phone]. NIA leads the federal government effort conducting and supporting research on aging and the health and well-being of older people. NIA's Alzheimer's Disease Education and Referral (ADEAR) Center offers information and publications on dementia and caregiving for families, caregivers, and professionals. For more information, visit CashCowGambling.be [2] or call [phone]. Also available from NIA are publications and information about Alzheimer's disease as well as the booklets Frontotemporal Disorders: Information for Patients, Families, and Caregivers and Lewy Body Dementia: Information for Patients, Families, and Professionals. Source URL: VoiceZap.is

## 2023-05-12 NOTE — Progress Notes (Signed)
[id number]    Provider:  Dr Lucia Gaskins Requesting Provider: Donetta Potts, MD Primary Care Provider:  Richardean Chimera, MD    CC:  Mild late onset Alzheimer's dementia without behavioral disturbance, psychotic disturbance, mood disturbance, or anxiety (HCC)  05/12/2023:  He is her for follow up on Alzheimer's dementia. He is not taking his medications and wife has to help. He is not driving, transportation is difficult. We discussed donanemab, lecanemab. Wife says it is short term memory, no behavioral issues, he gets aggravated he can't remember that, he gets a little confused. There is some depression. Wife provides most information.   Reviewed Dr. Marvetta Gibbons notes and discussed with patient 11/2021; Dr. Kieth Brightly  Diagnosis:                                Mild late onset Alzheimer's dementia without behavioral disturbance, psychotic disturbance, mood disturbance, or anxiety (HCC)  Review MRI of the brain images and reviewed with patient and wife. 04/06/2023:  MRI brain: FINDINGS: Brain: No acute infarct or hemorrhage. Unchanged mild asymmetric atrophy of the left occipital lobe. No hydrocephalus or extra-axial collection. No foci of abnormal susceptibility. No mass or abnormal enhancement.   Vascular: Normal flow voids and vessel enhancement.   Skull and upper cervical spine: Normal marrow signal and enhancement.   Sinuses/Orbits: No acute findings.   Other: None.   IMPRESSION: 1. No acute intracranial abnormality or mass. 2. Unchanged mild asymmetric atrophy of the left occipital lobe.    Patient complains of symptoms per HPI as well as the following symptoms: frustration . Pertinent negatives and positives per HPI. All others negative   05/01/2022: having some loose bowel movements, will not increase aricept, take fiber and bulking. Start [place] as well. Having some weird dreams, switch the aricept to the morning. Memory is stable. Discussed  lecanumab, they wish to hold on testing.   Patient complains of symptoms per HPI as well as the following symptoms: loose stools . Pertinent negatives and positives per HPI. All others negative  Follow up 02/26/2022: We discussed diagnosis. MRi of the brain and labwork unremarkable (rebiewed images), we discussed what to do next and lecanumab as below, we discussed Pet amyloid and genetic testing.    02/26/2022:  - Start Aricept/Donepezil 5mg  but cut the pill in half for a few weeks and monitor pulse and look for symptoms we discussed. For any chest pain call 911. Take at bedtime. Watch for nightmares(if so take in the morning) and stop for diarrhea or significant GI problems.  - Next if no symptoms would increase Aricept to 10mg , but I'd want to see you in the office in 8 weeks - A second medication is Namenda/Memantine that we could start after you are on a full of Aricept - The new medication is Lecanumab: [email or web address] : This medication is in its early stages in clinical use recently approved and we can discuss at office appointment. See link above and lots of information on the internet - [facility] referral to discuss Lecanumab, they declined at this time - in office 8 weeks to discuss lecanumab more and discuss diagnosis face to face  Patient complains of symptoms per HPI as well as the following symptoms: memory loss . Pertinent negatives and positives per HPI. All others negative   HPI:  James Mcdaniel is a 72 y.o. male here as requested by Donetta Potts, MD for short-term memory  loss. Patient says he will forget what his wife told him to get at the grocery store, he may have to think about it and remembers 3/4 items. He says his mind races, he has to juggle a lot of things. Wife provides much information and she feels he is getting confused, he goes to the microwave and gets confused how to use it, he loses his water glass all the time and other items, wife noticed it more  in the last 4-5 months worsening, started about a year ago with little things more short-term memory, he also repeats himself and tell her the same stories. Daughter has noticed the changes a well and the repeating of questions. Still driving and not getting lost. He has gotten a little confused with the finances, if he gets a new bill he may get confused, he may pay a bill wrong. He takes his own medications and doesn't forget about that, he is very good at medication. His mother had memory problems bu she also had a lot of medical problems, like COPD, she started having mid 64s with memory issues. A paternal cousin was diagnosed with dementia in her late 81s, progressed very fast, 1st cousin. He snores heavily. He falls asleep very quickly, he will doze off in the afternoons, when he sits he very active. When he wakesup in the morning he feels refreshed, he walks 4-6 miles a day. Snoring has decreased, she adjusts his pillows and now he doesn't snore as badly, no witnessed apneic events.    Review of Systems: Patient complains of symptoms per HPI as well as the following symptoms memory loss. Pertinent negatives and positives per HPI. All others negative.   Social History   Socioeconomic History   Marital status: Married    Spouse name: Not on file   Number of children: 2   Years of education: 14   Highest education level: Not on file  Occupational History   Not on file  Tobacco Use   Smoking status: Former    Current packs/day: 0.00    Average packs/day: 1.5 packs/day for 25.0 years (37.5 ttl pk-yrs)    Types: Cigarettes    Start date: 34    Quit date: 89    Years since quitting: 37.8   Smokeless tobacco: Never  Vaping Use   Vaping status: Never Used  Substance and Sexual Activity   Alcohol use: Yes    Comment: occ beer   Drug use: Not on file   Sexual activity: Not on file  Other Topics Concern   Not on file  Social History Narrative   Lives at home with wife   Right  handed   Caffeine: 2 cups coffee/day   Social Determinants of Health   Financial Resource Strain: Not on file  Food Insecurity: Not on file  Transportation Needs: Not on file  Physical Activity: Not on file  Stress: Not on file  Social Connections: Not on file  Intimate Partner Violence: Not on file    Family History  Problem Relation Age of Onset   Dementia Mother    COPD Mother    Heart failure Father    Rheumatic fever Father    Dementia Cousin     Past Medical History:  Diagnosis Date   High cholesterol     Patient Active Problem List   Diagnosis Date Noted   Mild late onset Alzheimer's dementia without behavioral disturbance, psychotic disturbance, mood disturbance, or anxiety (HCC) 02/26/2022   Short-term memory  loss 03/27/2021   FHx: dementia 03/27/2021   Pain in left knee 09/21/2019    Past Surgical History:  Procedure Laterality Date   HIP FRACTURE SURGERY Left    pin; as a teen    Current Outpatient Medications  Medication Sig Dispense Refill   atorvastatin (LIPITOR) 20 MG tablet Take 20 mg by mouth daily.     donepezil (ARICEPT) 5 MG tablet Take 1 tablet (5 mg total) by mouth in the morning. 90 tablet 0   lisinopril (ZESTRIL) 5 MG tablet Take 5 mg by mouth daily.     memantine (NAMENDA) 10 MG tablet Take 1 tablet (10 mg total) by mouth 2 (two) times daily. 180 tablet 3   VITAMIN E PO Take by mouth as needed.     No current facility-administered medications for this visit.    Allergies as of 05/12/2023   (No Known Allergies)    Vitals: BP 136/77   Pulse (!) 51   Ht 5\' 9"  (1.753 m)   Wt 167 lb 12.8 oz (76.1 kg)   BMI 24.78 kg/m  Last Weight:  Wt Readings from Last 1 Encounters:  05/12/23 167 lb 12.8 oz (76.1 kg)   Last Height:   Ht Readings from Last 1 Encounters:  05/12/23 5\' 9"  (1.753 m)    Physical exam: Exam: Gen: NAD                 CV: RRR Eyes: Conjunctivae clear without exudates or hemorrhage  Cognition:     05/12/2023     8:18 AM 03/27/2021    8:57 AM  MMSE - Mini Mental State Exam  Orientation to time 3 5  Orientation to Place 4 4  Registration 3 3  Attention/ Calculation 1 5  Recall 1 2  Language- name 2 objects 2 2  Language- repeat 1 1  Language- follow 3 step command 3 3  Language- read & follow direction 1 1  Write a sentence 0 1  Copy design 0 1  Total score 19 28    Exam: NAD, pleasant                  Speech:    Speech is normal; fluent and spontaneous with normal comprehension.    Cranial Nerves:    The pupils are equal, round, and reactive to light.Trigeminal sensation is intact and the muscles of mastication are normal. The face is symmetric. The palate elevates in the midline. Hearing intact. Voice is normal. Shoulder shrug is normal. The tongue has normal motion without fasciculations.   Coordination:  No dysmetria  Motor Observation:    No asymmetry, no atrophy, and no involuntary movements noted. Tone:    Normal muscle tone.     Strength:    Strength is V/V in the upper and lower limbs.      Sensation: intact to LT     Assessment/Plan:  Very nice 72 year old patient with short-term memory loss, and FHx of dementia.  Formal memory testing diagnosed with Mild late onset Alzheimer's dementia without behavioral disturbance, psychotic disturbance, mood disturbance, or anxiety (HCC)  Do not want to increase Aricept as that can cause lowered heart rate and your pulse is very low already. Continue 5mg  in morning To help with memory I would increase Memantine to 10mg  twice daily For frustration and possibly a little depression, may consider SSRI such as Zoloft would discuss with Roma Kayser or can mychart me  Not interested in [place] or [address]  at this time, no further workup per patient preference, focus on quality of life Recommend not driving Provided literature, online and local resources, reading materials, [employer] and other sites  Follow up with primary  care  Cc: Donetta Potts, MD,  Richardean Chimera, MD  Naomie Dean, MD  [facility] [address] [place], [place] [address]  Phone [phone] Fax [phone]  I spent 45 minutes of face-to-face and non-face-to-face time with patient on the  1. Mild late onset Alzheimer's dementia without behavioral disturbance, psychotic disturbance, mood disturbance, or anxiety (HCC)    diagnosis.  This included previsit chart review, lab review, study review, order entry, electronic health record documentation, patient education on the different diagnostic and therapeutic options, counseling and coordination of care, risks and benefits of management, compliance, or risk factor reduction

## 2023-06-02 ENCOUNTER — Telehealth: Payer: Self-pay | Admitting: *Deleted

## 2023-06-02 NOTE — Telephone Encounter (Signed)
R/c notes from [facility] care, Notes on Dr. Lucia Gaskins desk

## 2023-09-07 DIAGNOSIS — I10 Essential (primary) hypertension: Secondary | ICD-10-CM | POA: Diagnosis not present

## 2023-09-07 DIAGNOSIS — E1165 Type 2 diabetes mellitus with hyperglycemia: Secondary | ICD-10-CM | POA: Diagnosis not present

## 2023-09-07 DIAGNOSIS — E785 Hyperlipidemia, unspecified: Secondary | ICD-10-CM | POA: Diagnosis not present

## 2023-09-09 ENCOUNTER — Encounter: Payer: Self-pay | Admitting: *Deleted

## 2024-03-15 DIAGNOSIS — H524 Presbyopia: Secondary | ICD-10-CM | POA: Diagnosis not present

## 2024-03-15 DIAGNOSIS — H35031 Hypertensive retinopathy, right eye: Secondary | ICD-10-CM | POA: Diagnosis not present

## 2024-04-19 DIAGNOSIS — Z23 Encounter for immunization: Secondary | ICD-10-CM | POA: Diagnosis not present
